# Patient Record
Sex: Female | Born: 2000 | Race: Black or African American | Hispanic: No | Marital: Single | Smoking: Never smoker
Health system: Southern US, Community
[De-identification: ages and names within clinical notes are randomized; demographics above are authoritative.]

## PROBLEM LIST (undated history)

## (undated) DIAGNOSIS — M2560 Stiffness of unspecified joint, not elsewhere classified: Secondary | ICD-10-CM

## (undated) DIAGNOSIS — J45909 Unspecified asthma, uncomplicated: Secondary | ICD-10-CM

## (undated) DIAGNOSIS — Z973 Presence of spectacles and contact lenses: Secondary | ICD-10-CM

## (undated) DIAGNOSIS — M25562 Pain in left knee: Secondary | ICD-10-CM

---
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
## 2018-11-06 ENCOUNTER — Other Ambulatory Visit: Payer: Self-pay

## 2018-11-06 ENCOUNTER — Encounter (HOSPITAL_COMMUNITY): Payer: Self-pay

## 2018-11-06 ENCOUNTER — Ambulatory Visit (HOSPITAL_COMMUNITY)
Admission: EM | Admit: 2018-11-06 | Discharge: 2018-11-06 | Disposition: A | Discharge status: Home / Self Care | Payer: Self-pay | Attending: Family Medicine | Admitting: Family Medicine

## 2018-11-06 DIAGNOSIS — R69 Illness, unspecified: Secondary | ICD-10-CM

## 2018-11-06 DIAGNOSIS — J111 Influenza due to unidentified influenza virus with other respiratory manifestations: Secondary | ICD-10-CM

## 2018-11-06 MED ORDER — OSELTAMIVIR PHOSPHATE 75 MG PO CAPS: 75.0000 mg | ORAL_CAPSULE | Freq: Two times a day (BID) | ORAL | 0 refills | Status: DC

## 2018-11-06 NOTE — ED Provider Notes (Signed)
[facility]    CSN: [id number] Arrival date & time: 11/06/18  1043     History   Chief Complaint Chief Complaint  Patient presents with  . Diarrhea  . Headache    HPI Joanne Nelson is a 18 y.o. female.   Pt is a 18 year old female that presents with flu like symptoms to include; fever, chills, myalgias, sore throat, diarrhea.  This has been present and worsened over the past 2 days. Taking OTC meds for fever and symptoms with some relief and decrease in the fever. Positive sick contact but tested positive for flu. No recent traveling. Decrease in appetite but drinking fluids. No nausea and vomiting.   ROS per HPI       Past Medical History:  Diagnosis Date  . Asthma     There are no active problems to display for this patient.   History reviewed. No pertinent surgical history.  OB History   No obstetric history on file.      Home Medications    Prior to Admission medications   Medication Sig Start Date End Date Taking? Authorizing Provider  naproxen (NAPROSYN) 500 MG tablet Take 1 tablet (500 mg total) by mouth 2 (two) times daily. 12/31/17   Wieters, Hallie C, PA-C  oseltamivir (TAMIFLU) 75 MG capsule Take 1 capsule (75 mg total) by mouth every 12 (twelve) hours. 11/06/18   Janace Aris, NP    Family History History reviewed. No pertinent family history.  Social History Social History   Tobacco Use  . Smoking status: Never Smoker  . Smokeless tobacco: Never Used  Substance Use Topics  . Alcohol use: Never    Frequency: Never  . Drug use: Never     Allergies   Patient has no known allergies.   Review of Systems Review of Systems   Physical Exam Triage Vital Signs ED Triage Vitals  Enc Vitals Group     BP 11/06/18 1157 117/78     Pulse Rate 11/06/18 1157 77     Resp 11/06/18 1157 16     Temp 11/06/18 1157 98.3 F (36.8 C)     Temp Source 11/06/18 1157 Oral     SpO2 11/06/18 1157 100 %     Weight 11/06/18 1159 143 lb (64.9  kg)     Height --      Head Circumference --      Peak Flow --      Pain Score 11/06/18 1159 7     Pain Loc --      Pain Edu? --      Excl. in GC? --    No data found.  Updated Vital Signs BP 117/78 (BP Location: Right Arm)   Pulse 77   Temp 98.3 F (36.8 C) (Oral)   Resp 16   Wt 143 lb (64.9 kg)   LMP 10/02/2018   SpO2 100%   Visual Acuity Right Eye Distance:   Left Eye Distance:   Bilateral Distance:    Right Eye Near:   Left Eye Near:    Bilateral Near:     Physical Exam Vitals signs and nursing note reviewed.  Constitutional:      General: She is not in acute distress.    Appearance: She is well-developed. She is ill-appearing. She is not toxic-appearing or diaphoretic.  HENT:     Head: Normocephalic and atraumatic.     Mouth/Throat:     Mouth: Mucous membranes are moist.     Pharynx:  Oropharynx is clear.  Neck:     Musculoskeletal: Normal range of motion.  Cardiovascular:     Rate and Rhythm: Normal rate and regular rhythm.  Pulmonary:     Effort: Pulmonary effort is normal.     Breath sounds: Normal breath sounds.  Abdominal:     General: Bowel sounds are normal.     Palpations: Abdomen is soft.     Tenderness: There is no abdominal tenderness.  Musculoskeletal: Normal range of motion.  Skin:    General: Skin is warm and dry.  Neurological:     Mental Status: She is alert.  Psychiatric:        Mood and Affect: Mood normal.      UC Treatments / Results  Labs (all labs ordered are listed, but only abnormal results are displayed) Labs Reviewed - No data to display  EKG None  Radiology No results found.  Procedures Procedures (including critical care time)  Medications Ordered in UC Medications - No data to display  Initial Impression / Assessment and Plan / UC Course  I have reviewed the triage vital signs and the nursing notes.  Pertinent labs & imaging results that were available during my care of the patient were reviewed by me  and considered in my medical decision making (see chart for details).     Symptoms consistent with flu like illness She did have exposure to flu Tamiflu twice a day for 5 days Tylenol/ibuprofen for the myalgias and fever.  Follow up as needed for continued or worsening symptoms  Final Clinical Impressions(s) / UC Diagnoses   Final diagnoses:  Influenza-like illness     Discharge Instructions     We will go ahead and treat you for flu based on symptoms and exposure Tamiflu twice a day for next 5 days Tylenol/naproxen for pain and fevers Follow up as needed for continued or worsening symptoms     ED Prescriptions    Medication Sig Dispense Auth. Provider   oseltamivir (TAMIFLU) 75 MG capsule Take 1 capsule (75 mg total) by mouth every 12 (twelve) hours. 10 capsule Dahlia Byes A, NP     Controlled Substance Prescriptions [place] Controlled Substance Registry consulted? Not Applicable   Janace Aris, NP 11/06/18 [phone]

## 2018-11-06 NOTE — ED Triage Notes (Signed)
Pt cc headaches and body aches x 2 days the diarrhea started today.

## 2018-11-06 NOTE — Discharge Instructions (Addendum)
We will go ahead and treat you for flu based on symptoms and exposure Tamiflu twice a day for next 5 days Tylenol/naproxen for pain and fevers Follow up as needed for continued or worsening symptoms

## 2019-11-27 ENCOUNTER — Ambulatory Visit: Payer: Self-pay | Attending: Internal Medicine

## 2019-11-27 DIAGNOSIS — Z23 Encounter for immunization: Secondary | ICD-10-CM

## 2019-11-27 NOTE — Progress Notes (Signed)
   Covid-19 Vaccination Clinic  Name:  Joanne Nelson    MRN: [id number] DOB: 11-24-2000  11/27/2019  Joanne Nelson was observed post Covid-19 immunization for 15 minutes without incident. She was provided with Vaccine Information Sheet and instruction to access the V-Safe system.   Joanne Nelson was instructed to call 911 with any severe reactions post vaccine: Marland Kitchen Difficulty breathing  . Swelling of face and throat  . A fast heartbeat  . A bad rash all over body  . Dizziness and weakness   Immunizations Administered    Name Date Dose VIS Date Route   Moderna COVID-19 Vaccine 11/27/2019  3:30 PM 0.5 mL 08/19/2019 Intramuscular   Manufacturer: Moderna   Lot: [id number]   NDC: 43606-770-34

## 2019-12-30 ENCOUNTER — Ambulatory Visit: Payer: Self-pay | Attending: Family

## 2019-12-30 DIAGNOSIS — Z23 Encounter for immunization: Secondary | ICD-10-CM

## 2019-12-30 NOTE — Progress Notes (Signed)
   Covid-19 Vaccination Clinic  Name:  Joanne Nelson    MRN: [id number] DOB: [birth year]  12/30/2019  Joanne Nelson was observed post Covid-19 immunization for 15 minutes without incident. She was provided with Vaccine Information Sheet and instruction to access the V-Safe system.   Joanne Nelson was instructed to call 911 with any severe reactions post vaccine: Marland Kitchen Difficulty breathing  . Swelling of face and throat  . A fast heartbeat  . A bad rash all over body  . Dizziness and weakness   Immunizations Administered    Name Date Dose VIS Date Route   Moderna COVID-19 Vaccine 12/30/2019  1:46 PM 0.5 mL 08/19/2019 Intramuscular   Manufacturer: Moderna   Lot: [id number]   NDC: 17793-903-00

## 2020-07-01 ENCOUNTER — Other Ambulatory Visit: Payer: Self-pay

## 2020-07-01 ENCOUNTER — Emergency Department (INDEPENDENT_AMBULATORY_CARE_PROVIDER_SITE_OTHER)
Admission: RE | Admit: 2020-07-01 | Discharge: 2020-07-01 | Disposition: A | Discharge status: Home / Self Care | Payer: BC Managed Care – PPO | Source: Ambulatory Visit

## 2020-07-01 VITALS — BP 114/78 | HR 96 | Temp 99.0°F | Resp 16

## 2020-07-01 DIAGNOSIS — R590 Localized enlarged lymph nodes: Secondary | ICD-10-CM | POA: Diagnosis not present

## 2020-07-01 LAB — POCT CBC W AUTO DIFF (K'VILLE URGENT CARE)

## 2020-07-01 NOTE — Discharge Instructions (Signed)
°  Your symptoms are likely due to an early viral illness.  You may take 500mg  acetaminophen every 4-6 hours or in combination with ibuprofen 400-600mg  every 6-8 hours as needed for pain and inflammation.  Be sure to well hydrated with clear liquids and get at least 8 hours of sleep at night, preferably more while sick.   Please follow up with family medicine next week for recheck of symptoms if not improving.

## 2020-07-01 NOTE — ED Triage Notes (Signed)
Patient presents to Urgent Care with complaints of left sided neck pain/tenderness since about a week ago. Patient reports she does not have difficulty swallowing, but she can feel a "lump" on the left side of her neck when she swallows or touches it. Is tender to the touch.

## 2020-07-01 NOTE — ED Provider Notes (Signed)
[facility]    CSN: [id number] Arrival date & time: 07/01/20  0856      History   Chief Complaint Chief Complaint  Patient presents with  . Appointment    0900  . Neck Pain    HPI Joanne Nelson is a 19 y.o. female.   HPI  Joanne Nelson is a 19 y.o. female presenting to UC with c/o left sided neck soreness under her jaw that started about 1 week ago. Pain is aching and sore, tender "lump" on left side.  Denies fever, chills, n/v/d. No difficulty breathing or swallowing.  Pain is 7/10. She has not tried anything at home for the pain.    Past Medical History:  Diagnosis Date  . Asthma     There are no problems to display for this patient.   History reviewed. No pertinent surgical history.  OB History   No obstetric history on file.      Home Medications    Prior to Admission medications   Medication Sig Start Date End Date Taking? Authorizing Provider  naproxen (NAPROSYN) 500 MG tablet Take 1 tablet (500 mg total) by mouth 2 (two) times daily. 12/31/17   Wieters, Hallie C, PA-C  oseltamivir (TAMIFLU) 75 MG capsule Take 1 capsule (75 mg total) by mouth every 12 (twelve) hours. 11/06/18   Janace Aris, NP    Family History Family History  Problem Relation Age of Onset  . Hypertension Father     Social History Social History   Tobacco Use  . Smoking status: Never Smoker  . Smokeless tobacco: Never Used  Substance Use Topics  . Alcohol use: Yes    Comment: socially  . Drug use: Never     Allergies   Patient has no known allergies.   Review of Systems Review of Systems  Constitutional: Negative for chills and fever.  HENT: Negative for congestion, dental problem, ear pain, sore throat, trouble swallowing and voice change.   Respiratory: Negative for cough and shortness of breath.   Cardiovascular: Negative for chest pain and palpitations.  Gastrointestinal: Negative for abdominal pain, diarrhea, nausea and vomiting.  Musculoskeletal:  Positive for neck pain. Negative for arthralgias, back pain, myalgias and neck stiffness.  Skin: Negative for rash.  Neurological: Negative for dizziness, light-headedness and headaches.  All other systems reviewed and are negative.    Physical Exam Triage Vital Signs ED Triage Vitals  Enc Vitals Group     BP 07/01/20 0910 114/78     Pulse Rate 07/01/20 0910 96     Resp 07/01/20 0910 16     Temp 07/01/20 0910 99 F (37.2 C)     Temp Source 07/01/20 0910 Oral     SpO2 07/01/20 0910 97 %     Weight --      Height --      Head Circumference --      Peak Flow --      Pain Score 07/01/20 0908 7     Pain Loc --      Pain Edu? --      Excl. in GC? --    No data found.  Updated Vital Signs BP 114/78 (BP Location: Left Arm)   Pulse 96   Temp 99 F (37.2 C) (Oral)   Resp 16   SpO2 97%   Visual Acuity Right Eye Distance:   Left Eye Distance:   Bilateral Distance:    Right Eye Near:   Left Eye Near:  Bilateral Near:     Physical Exam Vitals and nursing note reviewed.  Constitutional:      Appearance: Normal appearance. She is well-developed.  HENT:     Head: Normocephalic and atraumatic.     Right Ear: Tympanic membrane and ear canal normal.     Left Ear: Tympanic membrane and ear canal normal.     Nose: Nose normal.     Right Sinus: No maxillary sinus tenderness or frontal sinus tenderness.     Left Sinus: No maxillary sinus tenderness or frontal sinus tenderness.     Mouth/Throat:     Lips: Pink.     Mouth: Mucous membranes are moist.     Dentition: Normal dentition. No dental tenderness, gingival swelling, dental caries or dental abscesses.     Pharynx: Oropharynx is clear. Uvula midline. No pharyngeal swelling, oropharyngeal exudate, posterior oropharyngeal erythema or uvula swelling.  Neck:     Vascular: No carotid bruit.   Cardiovascular:     Rate and Rhythm: Normal rate and regular rhythm.  Pulmonary:     Effort: Pulmonary effort is normal. No  respiratory distress.     Breath sounds: Normal breath sounds. No stridor. No wheezing, rhonchi or rales.  Musculoskeletal:        General: Normal range of motion.     Cervical back: Normal range of motion and neck supple. No rigidity or tenderness.  Lymphadenopathy:     Cervical: Cervical adenopathy present.  Skin:    General: Skin is warm and dry.  Neurological:     Mental Status: She is alert and oriented to person, place, and time.  Psychiatric:        Behavior: Behavior normal.      UC Treatments / Results  Labs (all labs ordered are listed, but only abnormal results are displayed) Labs Reviewed  POCT CBC W AUTO DIFF ([facility])    EKG   Radiology No results found.  Procedures Procedures (including critical care time)  Medications Ordered in UC Medications - No data to display  Initial Impression / Assessment and Plan / UC Course  I have reviewed the triage vital signs and the nursing notes.  Pertinent labs & imaging results that were available during my care of the patient were reviewed by me and considered in my medical decision making (see chart for details).    CBC: unremarkable Hx and exam c/w cervical lymphadenopathy without evidence of bacterial infection at this time Encouraged symptomatic tx and monitoring, f/u with PCP next week if not improving, especially if new symptoms develop AVS given  Final Clinical Impressions(s) / UC Diagnoses   Final diagnoses:  Anterior cervical lymphadenopathy     Discharge Instructions      Your symptoms are likely due to an early viral illness.  You may take 500mg  acetaminophen every 4-6 hours or in combination with ibuprofen 400-600mg  every 6-8 hours as needed for pain and inflammation.  Be sure to well hydrated with clear liquids and get at least 8 hours of sleep at night, preferably more while sick.   Please follow up with family medicine next week for recheck of symptoms if not improving.       ED Prescriptions    None     PDMP not reviewed this encounter.   , PA-C 07/01/20 1059

## 2020-09-12 ENCOUNTER — Emergency Department (HOSPITAL_COMMUNITY): Payer: BC Managed Care – PPO

## 2020-09-12 ENCOUNTER — Inpatient Hospital Stay (HOSPITAL_COMMUNITY)
Admission: EM | Admit: 2020-09-12 | Discharge: 2020-09-16 | DRG: 481 | Disposition: A | Discharge status: Home / Self Care | Payer: BC Managed Care – PPO | Attending: Surgery | Admitting: Surgery

## 2020-09-12 DIAGNOSIS — S2249XA Multiple fractures of ribs, unspecified side, initial encounter for closed fracture: Secondary | ICD-10-CM

## 2020-09-12 DIAGNOSIS — S2241XA Multiple fractures of ribs, right side, initial encounter for closed fracture: Secondary | ICD-10-CM | POA: Diagnosis present

## 2020-09-12 DIAGNOSIS — E876 Hypokalemia: Secondary | ICD-10-CM | POA: Diagnosis present

## 2020-09-12 DIAGNOSIS — D62 Acute posthemorrhagic anemia: Secondary | ICD-10-CM | POA: Diagnosis not present

## 2020-09-12 DIAGNOSIS — Y92413 State road as the place of occurrence of the external cause: Secondary | ICD-10-CM | POA: Diagnosis not present

## 2020-09-12 DIAGNOSIS — S72392A Other fracture of shaft of left femur, initial encounter for closed fracture: Secondary | ICD-10-CM | POA: Diagnosis present

## 2020-09-12 DIAGNOSIS — S61217A Laceration without foreign body of left little finger without damage to nail, initial encounter: Secondary | ICD-10-CM | POA: Diagnosis present

## 2020-09-12 DIAGNOSIS — S0081XA Abrasion of other part of head, initial encounter: Secondary | ICD-10-CM | POA: Diagnosis present

## 2020-09-12 DIAGNOSIS — R52 Pain, unspecified: Secondary | ICD-10-CM

## 2020-09-12 DIAGNOSIS — S7290XA Unspecified fracture of unspecified femur, initial encounter for closed fracture: Secondary | ICD-10-CM | POA: Diagnosis present

## 2020-09-12 DIAGNOSIS — S61412A Laceration without foreign body of left hand, initial encounter: Secondary | ICD-10-CM | POA: Diagnosis present

## 2020-09-12 DIAGNOSIS — T148XXA Other injury of unspecified body region, initial encounter: Secondary | ICD-10-CM

## 2020-09-12 DIAGNOSIS — S81012A Laceration without foreign body, left knee, initial encounter: Secondary | ICD-10-CM | POA: Diagnosis present

## 2020-09-12 DIAGNOSIS — T1490XA Injury, unspecified, initial encounter: Secondary | ICD-10-CM

## 2020-09-12 DIAGNOSIS — M795 Residual foreign body in soft tissue: Secondary | ICD-10-CM

## 2020-09-12 DIAGNOSIS — M25552 Pain in left hip: Secondary | ICD-10-CM | POA: Diagnosis present

## 2020-09-12 DIAGNOSIS — Z20822 Contact with and (suspected) exposure to covid-19: Secondary | ICD-10-CM | POA: Diagnosis present

## 2020-09-12 DIAGNOSIS — S7292XA Unspecified fracture of left femur, initial encounter for closed fracture: Secondary | ICD-10-CM

## 2020-09-12 LAB — I-STAT BETA HCG BLOOD, ED (MC, WL, AP ONLY): I-stat hCG, quantitative: <5 m[IU]/mL (ref <5)

## 2020-09-12 LAB — CBC
HCT: 37.2 % (ref 36.0–46.0)
Hemoglobin: 11.9 g/dL — ABNORMAL LOW (ref 12.0–15.0)
MCH: 29.5 pg (ref 26.0–34.0)
MCHC: 32 g/dL (ref 30.0–36.0)
MCV: 92.1 fL (ref 80.0–100.0)
Platelets: 409 10*3/uL — ABNORMAL HIGH (ref 150–400)
RBC: 4.04 MIL/uL (ref 3.87–5.11)
RDW: 12.9 % (ref 11.5–15.5)
WBC: 14.9 10*3/uL — ABNORMAL HIGH (ref 4.0–10.5)
nRBC: 0 % (ref 0.0–0.2)

## 2020-09-12 LAB — I-STAT CHEM 8, ED
BUN: 13 mg/dL (ref 6–20)
Calcium, Ion: 1.12 mmol/L — ABNORMAL LOW (ref 1.15–1.40)
Chloride: 104 mmol/L (ref 98–111)
Creatinine, Ser: 1.2 mg/dL — ABNORMAL HIGH (ref 0.44–1.00)
Glucose, Bld: 166 mg/dL — ABNORMAL HIGH (ref 70–99)
HCT: 40 % (ref 36.0–46.0)
Hemoglobin: 13.6 g/dL (ref 12.0–15.0)
Potassium: 2.7 mmol/L — CL (ref 3.5–5.1)
Sodium: 141 mmol/L (ref 135–145)
TCO2: 20 mmol/L — ABNORMAL LOW (ref 22–32)

## 2020-09-12 LAB — SAMPLE TO BLOOD BANK

## 2020-09-12 LAB — PROTIME-INR
INR: 1.2 (ref 0.8–1.2)
Prothrombin Time: 14.6 seconds (ref 11.4–15.2)

## 2020-09-12 LAB — LACTIC ACID, PLASMA: Lactic Acid, Venous: 3.6 mmol/L (ref 0.5–1.9)

## 2020-09-12 LAB — ETHANOL: Alcohol, Ethyl (B): <10 mg/dL (ref <10)

## 2020-09-12 MED ORDER — DOCUSATE SODIUM 100 MG PO CAPS: 100.0000 mg | ORAL_CAPSULE | Freq: Two times a day (BID) | ORAL | Status: DC

## 2020-09-12 MED ORDER — ONDANSETRON HCL 4 MG/2ML IJ SOLN
4.0000 mg | Freq: Four times a day (QID) | INTRAMUSCULAR | Status: DC
  Administered 2020-09-13 – 2020-09-15 (×10): 4 mg via INTRAVENOUS
  Filled 2020-09-12 (×12): qty 2

## 2020-09-12 MED ORDER — SODIUM CHLORIDE 0.9 % IV BOLUS
125.0000 mL | Freq: Once | INTRAVENOUS | Status: AC
  Administered 2020-09-12: 125 mL via INTRAVENOUS

## 2020-09-12 MED ORDER — ONDANSETRON HCL 4 MG/2ML IJ SOLN
4.0000 mg | Freq: Four times a day (QID) | INTRAMUSCULAR | Status: DC | PRN
  Administered 2020-09-13: 4 mg via INTRAVENOUS

## 2020-09-12 MED ORDER — MORPHINE SULFATE (PF) 2 MG/ML IV SOLN
2.0000 mg | INTRAVENOUS | Status: DC | PRN
  Administered 2020-09-13 (×2): 4 mg via INTRAVENOUS
  Filled 2020-09-12 (×2): qty 2

## 2020-09-12 MED ORDER — HYDROMORPHONE HCL 1 MG/ML IJ SOLN
1.0000 mg | INTRAMUSCULAR | Status: DC | PRN
  Administered 2020-09-13 (×2): 1 mg via INTRAVENOUS
  Filled 2020-09-12 (×2): qty 1

## 2020-09-12 MED ORDER — OXYCODONE HCL 5 MG PO TABS
5.0000 mg | ORAL_TABLET | ORAL | Status: DC | PRN
  Administered 2020-09-13: 10 mg via ORAL
  Filled 2020-09-12: qty 2

## 2020-09-12 MED ORDER — LACTATED RINGERS IV SOLN: INTRAVENOUS | Status: DC

## 2020-09-12 MED ORDER — CEFAZOLIN SODIUM-DEXTROSE 2-4 GM/100ML-% IV SOLN
2.0000 g | Freq: Once | INTRAVENOUS | Status: AC
  Administered 2020-09-12: 2 g via INTRAVENOUS
  Filled 2020-09-12: qty 100

## 2020-09-12 MED ORDER — ONDANSETRON 4 MG PO TBDP: 4.0000 mg | ORAL_TABLET | Freq: Four times a day (QID) | ORAL | Status: DC | PRN

## 2020-09-12 MED ORDER — ENOXAPARIN SODIUM 30 MG/0.3ML ~~LOC~~ SOLN
30.0000 mg | Freq: Two times a day (BID) | SUBCUTANEOUS | Status: DC
  Administered 2020-09-13 – 2020-09-16 (×6): 30 mg via SUBCUTANEOUS
  Filled 2020-09-12 (×6): qty 0.3

## 2020-09-12 MED ORDER — HYDROMORPHONE HCL 1 MG/ML IJ SOLN
INTRAMUSCULAR | Status: AC
  Administered 2020-09-12: 1 mg via INTRAVENOUS
  Filled 2020-09-12: qty 1

## 2020-09-12 MED ORDER — IOHEXOL 300 MG/ML  SOLN
100.0000 mL | Freq: Once | INTRAMUSCULAR | Status: AC | PRN
  Administered 2020-09-12: 100 mL via INTRAVENOUS

## 2020-09-12 MED ORDER — ACETAMINOPHEN 500 MG PO TABS
1000.0000 mg | ORAL_TABLET | Freq: Four times a day (QID) | ORAL | Status: DC
  Administered 2020-09-13 – 2020-09-16 (×13): 1000 mg via ORAL
  Filled 2020-09-12 (×14): qty 2
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
## 2020-09-12 NOTE — ED Notes (Signed)
pts family at bedside
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
## 2020-09-13 ENCOUNTER — Inpatient Hospital Stay (HOSPITAL_COMMUNITY): Payer: BC Managed Care – PPO

## 2020-09-13 ENCOUNTER — Inpatient Hospital Stay (HOSPITAL_COMMUNITY): Payer: BC Managed Care – PPO | Admitting: Anesthesiology

## 2020-09-13 ENCOUNTER — Encounter (HOSPITAL_COMMUNITY): Admission: EM | Disposition: A | Discharge status: Home / Self Care | Payer: Self-pay | Source: Home / Self Care

## 2020-09-13 HISTORY — PX: OTHER SURGICAL HISTORY: SHX169

## 2020-09-13 HISTORY — PX: FEMUR IM NAIL: SHX1597

## 2020-09-13 LAB — COMPREHENSIVE METABOLIC PANEL
ALT: 154 U/L — ABNORMAL HIGH (ref 0–44)
AST: 304 U/L — ABNORMAL HIGH (ref 15–41)
Albumin: 3.6 g/dL (ref 3.5–5.0)
Alkaline Phosphatase: 42 U/L (ref 38–126)
Anion gap: 15 (ref 5–15)
BUN: 13 mg/dL (ref 6–20)
CO2: 20 mmol/L — ABNORMAL LOW (ref 22–32)
Calcium: 9 mg/dL (ref 8.9–10.3)
Chloride: 104 mmol/L (ref 98–111)
Creatinine, Ser: 1.29 mg/dL — ABNORMAL HIGH (ref 0.44–1.00)
GFR, Estimated: >60 mL/min (ref >60)
Glucose, Bld: 173 mg/dL — ABNORMAL HIGH (ref 70–99)
Potassium: 2.7 mmol/L — CL (ref 3.5–5.1)
Sodium: 139 mmol/L (ref 135–145)
Total Bilirubin: 0.4 mg/dL (ref 0.3–1.2)
Total Protein: 6.9 g/dL (ref 6.5–8.1)

## 2020-09-13 LAB — BASIC METABOLIC PANEL
Anion gap: 9 (ref 5–15)
BUN: 11 mg/dL (ref 6–20)
CO2: 24 mmol/L (ref 22–32)
Calcium: 8.2 mg/dL — ABNORMAL LOW (ref 8.9–10.3)
Chloride: 103 mmol/L (ref 98–111)
Creatinine, Ser: 0.92 mg/dL (ref 0.44–1.00)
GFR, Estimated: >60 mL/min (ref >60)
Glucose, Bld: 121 mg/dL — ABNORMAL HIGH (ref 70–99)
Potassium: 3.9 mmol/L (ref 3.5–5.1)
Sodium: 136 mmol/L (ref 135–145)

## 2020-09-13 LAB — CBC
HCT: 26.5 % — ABNORMAL LOW (ref 36.0–46.0)
HCT: 22.3 % — ABNORMAL LOW (ref 36.0–46.0)
Hemoglobin: 8.6 g/dL — ABNORMAL LOW (ref 12.0–15.0)
Hemoglobin: 7.2 g/dL — ABNORMAL LOW (ref 12.0–15.0)
MCH: 29.4 pg (ref 26.0–34.0)
MCH: 29.5 pg (ref 26.0–34.0)
MCHC: 32.5 g/dL (ref 30.0–36.0)
MCHC: 32.3 g/dL (ref 30.0–36.0)
MCV: 90.4 fL (ref 80.0–100.0)
MCV: 91.4 fL (ref 80.0–100.0)
Platelets: 297 10*3/uL (ref 150–400)
Platelets: 254 10*3/uL (ref 150–400)
RBC: 2.93 MIL/uL — ABNORMAL LOW (ref 3.87–5.11)
RBC: 2.44 MIL/uL — ABNORMAL LOW (ref 3.87–5.11)
RDW: 12.9 % (ref 11.5–15.5)
RDW: 12.9 % (ref 11.5–15.5)
WBC: 8 10*3/uL (ref 4.0–10.5)
WBC: 8 10*3/uL (ref 4.0–10.5)
nRBC: 0 % (ref 0.0–0.2)
nRBC: 0 % (ref 0.0–0.2)

## 2020-09-13 LAB — PREPARE RBC (CROSSMATCH)

## 2020-09-13 LAB — RESP PANEL BY RT-PCR (FLU A&B, COVID) ARPGX2
Influenza A by PCR: NEGATIVE
Influenza B by PCR: NEGATIVE
SARS Coronavirus 2 by RT PCR: NEGATIVE

## 2020-09-13 LAB — HIV ANTIBODY (ROUTINE TESTING W REFLEX): HIV Screen 4th Generation wRfx: NONREACTIVE

## 2020-09-13 LAB — ABO/RH: ABO/RH(D): A POS

## 2020-09-13 LAB — MAGNESIUM: Magnesium: 1.8 mg/dL (ref 1.7–2.4)

## 2020-09-13 SURGERY — INSERTION, INTRAMEDULLARY ROD, FEMUR
Anesthesia: General | Site: Leg Upper | Laterality: Left

## 2020-09-13 MED ORDER — POTASSIUM CHLORIDE 10 MEQ/100ML IV SOLN
10.0000 meq | INTRAVENOUS | Status: DC
  Administered 2020-09-13: 10 meq via INTRAVENOUS
  Filled 2020-09-13: qty 100

## 2020-09-13 MED ORDER — CEFAZOLIN SODIUM-DEXTROSE 2-4 GM/100ML-% IV SOLN
2.0000 g | INTRAVENOUS | Status: AC
  Administered 2020-09-13: 2 g via INTRAVENOUS

## 2020-09-13 MED ORDER — ONDANSETRON HCL 4 MG/2ML IJ SOLN
INTRAMUSCULAR | Status: DC | PRN
  Administered 2020-09-13: 4 mg via INTRAVENOUS

## 2020-09-13 MED ORDER — SUGAMMADEX SODIUM 200 MG/2ML IV SOLN
INTRAVENOUS | Status: DC | PRN
  Administered 2020-09-13: 200 mg via INTRAVENOUS

## 2020-09-13 MED ORDER — SCOPOLAMINE 1 MG/3DAYS TD PT72: 1.0000 | MEDICATED_PATCH | TRANSDERMAL | Status: DC

## 2020-09-13 MED ORDER — TRANEXAMIC ACID-NACL 1000-0.7 MG/100ML-% IV SOLN
INTRAVENOUS | Status: AC
  Filled 2020-09-13: qty 100

## 2020-09-13 MED ORDER — PROPOFOL 10 MG/ML IV BOLUS
INTRAVENOUS | Status: AC
  Filled 2020-09-13: qty 20

## 2020-09-13 MED ORDER — SCOPOLAMINE 1 MG/3DAYS TD PT72
MEDICATED_PATCH | TRANSDERMAL | Status: AC
  Administered 2020-09-13: 1.5 mg via TRANSDERMAL
  Filled 2020-09-13: qty 1

## 2020-09-13 MED ORDER — SODIUM CHLORIDE 0.9 % IR SOLN
Status: DC | PRN
  Administered 2020-09-13: 1000 mL

## 2020-09-13 MED ORDER — CHLORHEXIDINE GLUCONATE 4 % EX LIQD: 60.0000 mL | Freq: Once | CUTANEOUS | Status: DC

## 2020-09-13 MED ORDER — TRANEXAMIC ACID-NACL 1000-0.7 MG/100ML-% IV SOLN
1000.0000 mg | INTRAVENOUS | Status: AC
  Administered 2020-09-13: 1000 mg via INTRAVENOUS

## 2020-09-13 MED ORDER — CHLORHEXIDINE GLUCONATE 0.12 % MT SOLN: 15.0000 mL | Freq: Once | OROMUCOSAL | Status: AC

## 2020-09-13 MED ORDER — OXYCODONE HCL 5 MG PO TABS
5.0000 mg | ORAL_TABLET | ORAL | Status: DC | PRN
  Administered 2020-09-14 – 2020-09-15 (×3): 10 mg via ORAL
  Filled 2020-09-13 (×4): qty 2

## 2020-09-13 MED ORDER — DEXMEDETOMIDINE (PRECEDEX) IN NS 20 MCG/5ML (4 MCG/ML) IV SYRINGE
PREFILLED_SYRINGE | INTRAVENOUS | Status: DC | PRN
  Administered 2020-09-13: 4 ug via INTRAVENOUS
  Administered 2020-09-13 (×2): 8 ug via INTRAVENOUS

## 2020-09-13 MED ORDER — CELECOXIB 200 MG PO CAPS
ORAL_CAPSULE | ORAL | Status: AC
  Administered 2020-09-13: 200 mg via ORAL
  Filled 2020-09-13: qty 1

## 2020-09-13 MED ORDER — ACETAMINOPHEN 325 MG PO TABS: 325.0000 mg | ORAL_TABLET | Freq: Four times a day (QID) | ORAL | Status: DC | PRN

## 2020-09-13 MED ORDER — ONDANSETRON HCL 4 MG PO TABS: 4.0000 mg | ORAL_TABLET | Freq: Four times a day (QID) | ORAL | Status: DC | PRN

## 2020-09-13 MED ORDER — ALBUMIN HUMAN 5 % IV SOLN: INTRAVENOUS | Status: DC | PRN

## 2020-09-13 MED ORDER — CELECOXIB 200 MG PO CAPS: 200.0000 mg | ORAL_CAPSULE | Freq: Once | ORAL | Status: AC

## 2020-09-13 MED ORDER — PROPOFOL 10 MG/ML IV BOLUS
INTRAVENOUS | Status: DC | PRN
  Administered 2020-09-13: 150 mg via INTRAVENOUS

## 2020-09-13 MED ORDER — OXYCODONE HCL 5 MG PO TABS
10.0000 mg | ORAL_TABLET | ORAL | Status: DC | PRN
  Administered 2020-09-13 – 2020-09-16 (×2): 10 mg via ORAL
  Filled 2020-09-13: qty 2

## 2020-09-13 MED ORDER — MIDAZOLAM HCL 2 MG/2ML IJ SOLN
INTRAMUSCULAR | Status: AC
  Filled 2020-09-13: qty 2

## 2020-09-13 MED ORDER — PHENYLEPHRINE 40 MCG/ML (10ML) SYRINGE FOR IV PUSH (FOR BLOOD PRESSURE SUPPORT)
PREFILLED_SYRINGE | INTRAVENOUS | Status: DC | PRN
  Administered 2020-09-13: 120 ug via INTRAVENOUS

## 2020-09-13 MED ORDER — CEFAZOLIN SODIUM-DEXTROSE 1-4 GM/50ML-% IV SOLN
1.0000 g | Freq: Four times a day (QID) | INTRAVENOUS | Status: AC
  Administered 2020-09-13 – 2020-09-14 (×3): 1 g via INTRAVENOUS
  Filled 2020-09-13 (×3): qty 50

## 2020-09-13 MED ORDER — LACTATED RINGERS IV BOLUS
1000.0000 mL | Freq: Once | INTRAVENOUS | Status: AC
  Administered 2020-09-13: 1000 mL via INTRAVENOUS

## 2020-09-13 MED ORDER — POVIDONE-IODINE 10 % EX SWAB: 2.0000 "application " | Freq: Once | CUTANEOUS | Status: DC

## 2020-09-13 MED ORDER — PROMETHAZINE HCL 25 MG/ML IJ SOLN: 6.2500 mg | INTRAMUSCULAR | Status: DC | PRN

## 2020-09-13 MED ORDER — ROCURONIUM BROMIDE 10 MG/ML (PF) SYRINGE
PREFILLED_SYRINGE | INTRAVENOUS | Status: DC | PRN
  Administered 2020-09-13: 80 mg via INTRAVENOUS
  Administered 2020-09-13: 50 mg via INTRAVENOUS

## 2020-09-13 MED ORDER — PHENYLEPHRINE HCL-NACL 10-0.9 MG/250ML-% IV SOLN
INTRAVENOUS | Status: DC | PRN
  Administered 2020-09-13: 30 ug/min via INTRAVENOUS

## 2020-09-13 MED ORDER — DEXAMETHASONE SODIUM PHOSPHATE 10 MG/ML IJ SOLN
INTRAMUSCULAR | Status: DC | PRN
  Administered 2020-09-13: 10 mg via INTRAVENOUS

## 2020-09-13 MED ORDER — HYDROMORPHONE HCL 1 MG/ML IJ SOLN
INTRAMUSCULAR | Status: AC
  Administered 2020-09-13: 0.25 mg via INTRAVENOUS
  Filled 2020-09-13: qty 1

## 2020-09-13 MED ORDER — CHLORHEXIDINE GLUCONATE 0.12 % MT SOLN
OROMUCOSAL | Status: AC
  Administered 2020-09-13: 15 mL via OROMUCOSAL
  Filled 2020-09-13: qty 15

## 2020-09-13 MED ORDER — ONDANSETRON HCL 4 MG/2ML IJ SOLN
4.0000 mg | Freq: Four times a day (QID) | INTRAMUSCULAR | Status: DC | PRN
  Administered 2020-09-15: 4 mg via INTRAVENOUS

## 2020-09-13 MED ORDER — METOCLOPRAMIDE HCL 5 MG/ML IJ SOLN: 5.0000 mg | Freq: Three times a day (TID) | INTRAMUSCULAR | Status: DC | PRN

## 2020-09-13 MED ORDER — LIDOCAINE 2% (20 MG/ML) 5 ML SYRINGE
INTRAMUSCULAR | Status: DC | PRN
  Administered 2020-09-13: 100 mg via INTRAVENOUS

## 2020-09-13 MED ORDER — FENTANYL CITRATE (PF) 250 MCG/5ML IJ SOLN
INTRAMUSCULAR | Status: DC | PRN
  Administered 2020-09-13 (×5): 50 ug via INTRAVENOUS

## 2020-09-13 MED ORDER — DOCUSATE SODIUM 100 MG PO CAPS
100.0000 mg | ORAL_CAPSULE | Freq: Two times a day (BID) | ORAL | Status: DC
  Administered 2020-09-13 – 2020-09-16 (×5): 100 mg via ORAL
  Filled 2020-09-13 (×6): qty 1

## 2020-09-13 MED ORDER — MIDAZOLAM HCL 5 MG/5ML IJ SOLN
INTRAMUSCULAR | Status: DC | PRN
  Administered 2020-09-13: 2 mg via INTRAVENOUS

## 2020-09-13 MED ORDER — SODIUM CHLORIDE 0.9% IV SOLUTION: Freq: Once | INTRAVENOUS | Status: DC

## 2020-09-13 MED ORDER — HYDROMORPHONE HCL 1 MG/ML IJ SOLN
0.2500 mg | INTRAMUSCULAR | Status: DC | PRN
  Administered 2020-09-13: 0.25 mg via INTRAVENOUS

## 2020-09-13 MED ORDER — METOCLOPRAMIDE HCL 5 MG PO TABS: 5.0000 mg | ORAL_TABLET | Freq: Three times a day (TID) | ORAL | Status: DC | PRN

## 2020-09-13 MED ORDER — CEFAZOLIN SODIUM-DEXTROSE 2-4 GM/100ML-% IV SOLN
INTRAVENOUS | Status: AC
  Filled 2020-09-13: qty 100

## 2020-09-13 MED ORDER — FENTANYL CITRATE (PF) 250 MCG/5ML IJ SOLN
INTRAMUSCULAR | Status: AC
  Filled 2020-09-13: qty 5

## 2020-09-13 SURGICAL SUPPLY — 61 items
BIT DRILL AO GAMMA 4.2X180 (BIT) ×2 IMPLANT
BIT DRILL AO GAMMA 4.2X340 (BIT) ×2 IMPLANT
BIT DRILL CANN 2.7 (BIT) ×1
BIT DRILL CANN 3.7 (Screw) ×2 IMPLANT
BIT DRILL SRG 2.7XCANN AO CPLG (BIT) ×1 IMPLANT
BIT DRL SRG 2.7XCANN AO CPLNG (BIT) ×1
CLSR STERI-STRIP ANTIMIC 1/2X4 (GAUZE/BANDAGES/DRESSINGS) ×2 IMPLANT
COVER MAYO STAND STRL (DRAPES) ×2 IMPLANT
COVER PERINEAL POST (MISCELLANEOUS) ×2 IMPLANT
COVER SURGICAL LIGHT HANDLE (MISCELLANEOUS) ×2 IMPLANT
COVER WAND RF STERILE (DRAPES) ×2 IMPLANT
DRAPE STERI IOBAN 125X83 (DRAPES) ×2 IMPLANT
DRSG MEPILEX BORDER 4X4 (GAUZE/BANDAGES/DRESSINGS) ×6 IMPLANT
DURAPREP 26ML APPLICATOR (WOUND CARE) ×2 IMPLANT
ELECT REM PT RETURN 9FT ADLT (ELECTROSURGICAL) ×2
ELECTRODE REM PT RTRN 9FT ADLT (ELECTROSURGICAL) ×1 IMPLANT
GLOVE BIO SURGEON STRL SZ7.5 (GLOVE) ×2 IMPLANT
GLOVE BIOGEL PI IND STRL 7.5 (GLOVE) ×1 IMPLANT
GLOVE BIOGEL PI IND STRL 8 (GLOVE) ×1 IMPLANT
GLOVE BIOGEL PI INDICATOR 7.5 (GLOVE) ×1
GLOVE BIOGEL PI INDICATOR 8 (GLOVE) ×1
GLOVE SURG SYN 7.5  E (GLOVE) ×1
GLOVE SURG SYN 7.5 E (GLOVE) ×1 IMPLANT
GOWN STRL REUS W/ TWL LRG LVL3 (GOWN DISPOSABLE) ×2 IMPLANT
GOWN STRL REUS W/ TWL XL LVL3 (GOWN DISPOSABLE) ×2 IMPLANT
GOWN STRL REUS W/TWL LRG LVL3 (GOWN DISPOSABLE) ×2
GOWN STRL REUS W/TWL XL LVL3 (GOWN DISPOSABLE) ×2
GUIDEPIN SMOOTH TT 2.4X9 (Screw) ×2 IMPLANT
GUIDEROD T2 3X1000 (ROD) ×2 IMPLANT
IMMOBILIZER KNEE 20 (SOFTGOODS) ×2
IMMOBILIZER KNEE 20 THIGH 36 (SOFTGOODS) ×1 IMPLANT
K-WIRE ORTHOPEDIC 1.4X150L (WIRE) ×4
K-WIRE RECON 3.2X400 (WIRE) ×4
KIT BASIN OR (CUSTOM PROCEDURE TRAY) ×2 IMPLANT
KIT TURNOVER KIT B (KITS) ×2 IMPLANT
KWIRE ORTHOPEDIC 1.4X150L (WIRE) ×2 IMPLANT
KWIRE RECON 3.2X400 (WIRE) ×2 IMPLANT
MANIFOLD NEPTUNE II (INSTRUMENTS) ×2 IMPLANT
NAIL RECONSTRUCTION 9X340X125 (Nail) ×2 IMPLANT
NS IRRIG 1000ML POUR BTL (IV SOLUTION) ×2 IMPLANT
PACK GENERAL/GYN (CUSTOM PROCEDURE TRAY) ×2 IMPLANT
PAD ARMBOARD 7.5X6 YLW CONV (MISCELLANEOUS) ×4 IMPLANT
REAMER SHAFT BIXCUT (INSTRUMENTS) ×2 IMPLANT
SCREW CANN LAG RECON 6.5X75 (Screw) ×2 IMPLANT
SCREW CANN SD 5.5X70 (Screw) ×2 IMPLANT
SCREW CANNULATED 4.0X70MM (Screw) ×2 IMPLANT
SCREW LAG RECON 6.5X80MM (Screw) ×2 IMPLANT
SCREW LOCKING T2 F/T  5X42.5MM (Screw) ×1 IMPLANT
SCREW LOCKING T2 F/T 5X42.5MM (Screw) ×1 IMPLANT
SCREW LOCKING THREADED 5X47.5 (Screw) ×2 IMPLANT
SCREW SET RECON (Screw) ×2 IMPLANT
STEPDRILL FOR LAG SCREW RECON (DRILL) ×2 IMPLANT
STRIP CLOSURE SKIN 1/2X4 (GAUZE/BANDAGES/DRESSINGS) ×2 IMPLANT
SUT MNCRL AB 4-0 PS2 18 (SUTURE) ×2 IMPLANT
SUT VIC AB 0 CT1 27 (SUTURE) ×1
SUT VIC AB 0 CT1 27XBRD ANBCTR (SUTURE) ×1 IMPLANT
SUT VIC AB 2-0 CT1 27 (SUTURE) ×1
SUT VIC AB 2-0 CT1 TAPERPNT 27 (SUTURE) ×1 IMPLANT
TOWEL GREEN STERILE (TOWEL DISPOSABLE) ×2 IMPLANT
TOWEL OR NON WOVEN STRL DISP B (DISPOSABLE) ×2 IMPLANT
WATER STERILE IRR 1000ML POUR (IV SOLUTION) ×2 IMPLANT

## 2020-09-13 NOTE — ED Notes (Signed)
Ortho tech contacted for [employer] placement

## 2020-09-13 NOTE — ED Notes (Signed)
Mother at bedside with patient and was updated on plan of care, verbalized understanding and denies further questions. Patient provided with additional warm blanket per her request. They deny further needs. Traction remains in place to LLE with good cap refill to distal extremity.
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
## 2020-09-13 NOTE — ED Notes (Signed)
Attempted report 
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
## 2020-09-13 NOTE — Anesthesia Preprocedure Evaluation (Addendum)
Anesthesia Evaluation  Patient identified by MRN, date of birth, ID band Patient awake    Reviewed: Allergy & Precautions, NPO status , Patient's Chart, lab work & pertinent test results  History of Anesthesia Complications Negative for: history of anesthetic complications  Airway Mallampati: II  TM Distance: >3 FB Neck ROM: Full    Dental no notable dental hx. (+) Dental Advisory Given   Pulmonary  Multiple FX ribs (R3-6)   Pulmonary exam normal        Cardiovascular negative cardio ROS   Rhythm:Regular Rate:Normal     Neuro/Psych negative neurological ROS     GI/Hepatic negative GI ROS, Neg liver ROS,   Endo/Other  negative endocrine ROS  Renal/GU negative Renal ROS     Musculoskeletal   Abdominal   Peds  Hematology negative hematology ROS (+)   Anesthesia Other Findings   Reproductive/Obstetrics                            Anesthesia Physical Anesthesia Plan  ASA: III  Anesthesia Plan: General   Post-op Pain Management:    Induction: Intravenous  PONV Risk Score and Plan: 4 or greater and Ondansetron, Dexamethasone, Midazolam and Scopolamine patch - Pre-op  Airway Management Planned: Oral ETT  Additional Equipment:   Intra-op Plan:   Post-operative Plan: Extubation in OR  Informed Consent: I have reviewed the patients History and Physical, chart, labs and discussed the procedure including the risks, benefits and alternatives for the proposed anesthesia with the patient or authorized representative who has indicated his/her understanding and acceptance.     Dental advisory given  Plan Discussed with: Anesthesiologist and CRNA  Anesthesia Plan Comments:        Anesthesia Quick Evaluation

## 2020-09-13 NOTE — OR Nursing (Signed)
Pt is awake,alert and oriented.Pt and/or family verbalized understanding of poc and discharge instructions. Reviewed admission and on going care with receiving RN. Pt is in NAD at this time and is ready to be transferred to floor. Will con't to monitor until pt is transferred.  Pt on Monitor  

## 2020-09-13 NOTE — Transfer of Care (Signed)
Immediate Anesthesia Transfer of Care Note  Patient: Joanne Nelson  Procedure(s) Performed: INTRAMEDULLARY (IM) NAIL FEMORAL (Left Leg Upper)  Patient Location: PACU  Anesthesia Type:General  Level of Consciousness: awake, alert  and oriented  Airway & Oxygen Therapy: Patient Spontanous Breathing and Patient connected to face mask oxygen  Post-op Assessment: Report given to RN and Post -op Vital signs reviewed and stable  Post vital signs: Reviewed and stable  Last Vitals:  Vitals Value Taken Time  BP    Temp    Pulse    Resp    SpO2      Last Pain:  Vitals:   09/13/20 1032  TempSrc: Oral  PainSc:          Complications: No complications documented.

## 2020-09-13 NOTE — Progress Notes (Signed)
Orthopedic Tech Progress Note Patient Details:  Joanne Nelson 19-Sep-2000 [id number]  Musculoskeletal Traction Type of Traction: Bucks Skin Traction Traction Location: lle Traction Weight: 10 lbs   Post Interventions Patient Tolerated: Well Instructions Provided: Care of device,Adjustment of device   Trinna Post 09/13/2020, 3:47 AM

## 2020-09-13 NOTE — Progress Notes (Signed)
OT Cancellation Note  Patient Details Name: Sunita Demond MRN: [id number] DOB: [birth year]   Cancelled Treatment:    Reason Eval/Treat Not Completed: Other (comment) (Eval to start 12/28. In buckstraction this AM. Thank you.)  Haylen Bellotti M Yunior Jain Annalyce Lanpher MSOT, OTR/L Acute Rehab Pager: [phone] Office: [phone] 09/13/2020, 9:10 AM

## 2020-09-13 NOTE — OR Nursing (Signed)
Lab at bedside

## 2020-09-13 NOTE — ED Notes (Signed)
Offered to place purewick on pt, pt stated she may want a purewick later, but presently does not want one placed, or feel the need to urinate.

## 2020-09-13 NOTE — ED Notes (Signed)
Dr. Carlena Hurl paged to ask about potassium level

## 2020-09-13 NOTE — ED Notes (Signed)
Ordered a hospital bed 

## 2020-09-13 NOTE — ED Provider Notes (Signed)
Buck's traction 10 pounds applied.   Dione Booze, MD 09/13/20 [phone]

## 2020-09-13 NOTE — H&P (Signed)
I have reviewed images. Need left femur dedicated films.   Tentative plan: X-ray L femur Or Monday for IM nail    Formal c/s to follow

## 2020-09-13 NOTE — ED Notes (Signed)
PA notified of k of 2.7.

## 2020-09-13 NOTE — Anesthesia Procedure Notes (Signed)
Procedure Name: Intubation Date/Time: 09/13/2020 2:30 PM Performed by: Griffin Dakin, CRNA Pre-anesthesia Checklist: Patient identified, Emergency Drugs available, Suction available and Patient being monitored Patient Re-evaluated:Patient Re-evaluated prior to induction Oxygen Delivery Method: Circle system utilized Preoxygenation: Pre-oxygenation with 100% oxygen Induction Type: IV induction Ventilation: Mask ventilation without difficulty Laryngoscope Size: Mac and 3 Grade View: Grade I Tube type: Oral Tube size: 7.0 mm Number of attempts: 1 Airway Equipment and Method: Stylet and Oral airway Placement Confirmation: ETT inserted through vocal cords under direct vision,  positive ETCO2 and breath sounds checked- equal and bilateral Secured at: 22 cm Tube secured with: Tape Dental Injury: Teeth and Oropharynx as per pre-operative assessment

## 2020-09-14 ENCOUNTER — Encounter (HOSPITAL_COMMUNITY): Payer: Self-pay

## 2020-09-14 ENCOUNTER — Inpatient Hospital Stay (HOSPITAL_COMMUNITY): Payer: BC Managed Care – PPO

## 2020-09-14 ENCOUNTER — Other Ambulatory Visit: Payer: Self-pay

## 2020-09-14 LAB — BASIC METABOLIC PANEL
Anion gap: 9 (ref 5–15)
BUN: 7 mg/dL (ref 6–20)
CO2: 23 mmol/L (ref 22–32)
Calcium: 8 mg/dL — ABNORMAL LOW (ref 8.9–10.3)
Chloride: 102 mmol/L (ref 98–111)
Creatinine, Ser: 0.95 mg/dL (ref 0.44–1.00)
GFR, Estimated: >60 mL/min (ref >60)
Glucose, Bld: 120 mg/dL — ABNORMAL HIGH (ref 70–99)
Potassium: 3.8 mmol/L (ref 3.5–5.1)
Sodium: 134 mmol/L — ABNORMAL LOW (ref 135–145)

## 2020-09-14 LAB — CBC
HCT: 20.3 % — ABNORMAL LOW (ref 36.0–46.0)
HCT: 27.9 % — ABNORMAL LOW (ref 36.0–46.0)
Hemoglobin: 6.5 g/dL — CL (ref 12.0–15.0)
Hemoglobin: 9.7 g/dL — ABNORMAL LOW (ref 12.0–15.0)
MCH: 29.4 pg (ref 26.0–34.0)
MCH: 29.8 pg (ref 26.0–34.0)
MCHC: 32 g/dL (ref 30.0–36.0)
MCHC: 34.8 g/dL (ref 30.0–36.0)
MCV: 91.9 fL (ref 80.0–100.0)
MCV: 85.6 fL (ref 80.0–100.0)
Platelets: 223 10*3/uL (ref 150–400)
Platelets: 210 10*3/uL (ref 150–400)
RBC: 2.21 MIL/uL — ABNORMAL LOW (ref 3.87–5.11)
RBC: 3.26 MIL/uL — ABNORMAL LOW (ref 3.87–5.11)
RDW: 12.9 % (ref 11.5–15.5)
RDW: 13.7 % (ref 11.5–15.5)
WBC: 6.5 10*3/uL (ref 4.0–10.5)
WBC: 8.2 10*3/uL (ref 4.0–10.5)
nRBC: 0 % (ref 0.0–0.2)
nRBC: 0 % (ref 0.0–0.2)

## 2020-09-14 LAB — URINALYSIS, ROUTINE W REFLEX MICROSCOPIC
Bacteria, UA: NONE SEEN
Bilirubin Urine: NEGATIVE
Glucose, UA: NEGATIVE mg/dL
Ketones, ur: NEGATIVE mg/dL
Leukocytes,Ua: NEGATIVE
Nitrite: NEGATIVE
Protein, ur: 30 mg/dL — AB
Specific Gravity, Urine: 1.04 — ABNORMAL HIGH (ref 1.005–1.030)
pH: 5 (ref 5.0–8.0)

## 2020-09-14 LAB — PREPARE RBC (CROSSMATCH)

## 2020-09-14 MED ORDER — SODIUM CHLORIDE 0.9% IV SOLUTION: Freq: Once | INTRAVENOUS | Status: AC

## 2020-09-14 NOTE — Plan of Care (Signed)
  Problem: Education: Goal: Knowledge of General Education information will improve Description: Including pain rating scale, medication(s)/side effects and non-pharmacologic comfort measures Outcome: Progressing   Problem: Health Behavior/Discharge Planning: Goal: Ability to manage health-related needs will improve Outcome: Progressing   Problem: Clinical Measurements: Goal: Ability to maintain clinical measurements within normal limits will improve Outcome: Progressing Goal: Will remain free from infection Outcome: Progressing Goal: Diagnostic test results will improve Outcome: Progressing Goal: Respiratory complications will improve Outcome: Progressing Goal: Cardiovascular complication will be avoided Outcome: Progressing   Problem: Activity: Goal: Risk for activity intolerance will decrease Outcome: Progressing   Problem: Nutrition: Goal: Adequate nutrition will be maintained Outcome: Progressing   Problem: Coping: Goal: Level of anxiety will decrease Outcome: Progressing   Problem: Elimination: Goal: Will not experience complications related to bowel motility Outcome: Progressing Goal: Will not experience complications related to urinary retention Outcome: Progressing   Problem: Pain Managment: Goal: General experience of comfort will improve Outcome: Progressing   Problem: Safety: Goal: Ability to remain free from injury will improve Outcome: Progressing   Problem: Skin Integrity: Goal: Risk for impaired skin integrity will decrease Outcome: Progressing   Problem: Activity: Goal: Ability to avoid complications of mobility impairment will improve Outcome: Progressing Goal: Ability to tolerate increased activity will improve Outcome: Progressing   Problem: Education: Goal: Verbalization of understanding the information provided will improve Outcome: Progressing   Problem: Coping: Goal: Level of anxiety will decrease Outcome: Progressing   Problem:  Physical Regulation: Goal: Postoperative complications will be avoided or minimized Outcome: Progressing   Problem: Respiratory: Goal: Ability to maintain a clear airway will improve Outcome: Progressing   Problem: Pain Management: Goal: Pain level will decrease Outcome: Progressing   Problem: Skin Integrity: Goal: Signs of wound healing will improve Outcome: Progressing   Problem: Tissue Perfusion: Goal: Ability to maintain adequate tissue perfusion will improve Outcome: Progressing   

## 2020-09-14 NOTE — Progress Notes (Signed)
    Subjective: Patient is sleeping comfortably. She has a unit of PRBCs hung and transfusing into her. I spoke with her mother in the room who stated that the patient reports pain as mild to moderate. She said [facility] told her that she is "feeling much better now and can even move her ankle and foot better than before surgery."  Tolerating diet. Wants to get up to use the restroom rather than a bedside commode. Not yet mobilized. PT/OT should be coming to see patient today to help her learn how to move about with crutches and do toe touch WBAT. Denies CP, SOB. No weakness/dizziness  Objective:   VITALS:   Vitals:   09/14/20 0350 09/14/20 0615 09/14/20 0644 09/14/20 0651  BP: 108/76 124/60 (!) 104/56 (!) 107/56  Pulse:  83 78 86  Resp: 16 15 16 16   Temp: 98.5 F (36.9 C) 98.1 F (36.7 C)  97.6 F (36.4 C)  TempSrc: Oral Oral  Oral  SpO2: 98% 96% 98% 98%  Weight:      Height:       CBC Latest Ref Rng & Units 09/14/2020 09/13/2020 09/13/2020  WBC 4.0 - 10.5 K/uL 6.5 8.0 8.0  Hemoglobin 12.0 - 15.0 g/dL 6.5(LL) 7.2(L) 8.6(L)  Hematocrit 36.0 - 46.0 % 20.3(L) 22.3(L) 26.5(L)  Platelets 150 - 400 K/uL 223 254 297   BMP Latest Ref Rng & Units 09/14/2020 09/13/2020 09/12/2020  Glucose 70 - 99 mg/dL 09/14/2020) 696(E) 952(W)  BUN 6 - 20 mg/dL 7 11 13   Creatinine 0.44 - 1.00 mg/dL 413(K 4.40)  Sodium 135 - 145 mmol/L 134(L) 136 141  Potassium 3.5 - 5.1 mmol/L 3.8 3.9 2.7(LL)  Chloride 98 - 111 mmol/L 102 103 104  CO2 22 - 32 mmol/L 23 24 -  Calcium 8.9 - 10.3 mg/dL 8.0(L) 8.2(L) -   Intake/Output      12/27 0701 12/28 0700 12/28 0701 12/29 0700   P.O. 120 240   I.V. (mL/kg) 1979.8 (31.1)    Blood 310    IV Piggyback 596.5    Total Intake(mL/kg) 3006.3 (47.3) 240 (3.8)   Urine (mL/kg/hr) 2099 (1.4)    Blood 100    Total Output 2199    Net +807.3 +240        Urine Occurrence 1 x        Physical Exam: General: NAD. Sleeping in bed.  Calm. No increased work of  breathing. MSK Neurovascularly intact Sensation intact distally Intact pulses distally Dorsiflexion/Plantar flexion intact Incisions: left thigh, left knee, and left small finger all covered with dressings that are c/d/i. Knee immobilizer in place   Assessment: 1 Day Post-Op  S/P Procedure(s) (LRB): INTRAMEDULLARY (IM) NAIL FEMORAL (Left) by Dr. 2100. Murphy on 09/13/20  Active Problems:   Femur fracture (HCC)  Closed left femur fractures, status post IM nail Doing well postop day 1 Tolerating diet and voiding Pain controlled Not yet mobilized  Plan: Up with therapy Incentive Spirometry Apply ice PRN H&H down to 6.5 this morning. Received another unit of blood. Another CBC ordered   Weightbearing: TDWB LLE Insicional and dressing care: Dressings left intact Showering: Keep dressing dry VTE prophylaxis:  Lovenox 30mg  q12h, SCDs Pain control: Continue current regimen Contact information:  Jewel Baize MD, 09/15/20 PA-C  Dispo: TBD.  Therapy evaluations pending.  Discharge when mobilized and ready medically.   , PA-C 09/14/2020, 11:41 AM
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
## 2020-09-14 NOTE — TOC CAGE-AID Note (Signed)
Transition of Care [facility]) - CAGE-AID Screening   Patient Details  Name: Joanne Nelson MRN: [id number] Date of Birth: 05-Apr-2001   Clinical Narrative: Patient endorses social drinking. She denies being criticized for feeling guilty of her drinking. She also denies drinking to help calm her nerves. Patient states she does not need for resources at this time.    CAGE-AID Screening:    Have You Ever Felt You Ought to Cut Down on Your Drinking or Drug Use?: No Have People Annoyed You By Critizing Your Drinking Or Drug Use?: No Have You Felt Bad Or Guilty About Your Drinking Or Drug Use?: No Have You Ever Had a Drink or Used Drugs First Thing In The Morning to Steady Your Nerves or to Get Rid of a Hangover?: No CAGE-AID Score: 0

## 2020-09-14 NOTE — Anesthesia Postprocedure Evaluation (Signed)
Anesthesia Post Note  Patient: Joanne Nelson  Procedure(s) Performed: INTRAMEDULLARY (IM) NAIL FEMORAL (Left Leg Upper)     Patient location during evaluation: PACU Anesthesia Type: General Level of consciousness: sedated Pain management: pain level controlled Vital Signs Assessment: post-procedure vital signs reviewed and stable Respiratory status: spontaneous breathing and respiratory function stable Cardiovascular status: stable Postop Assessment: no apparent nausea or vomiting Anesthetic complications: no   No complications documented.                Aubrey Blackard,Neuharth DANIEL
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
## 2020-09-15 ENCOUNTER — Inpatient Hospital Stay (HOSPITAL_COMMUNITY): Payer: BC Managed Care – PPO

## 2020-09-15 LAB — CBC
HCT: 25.4 % — ABNORMAL LOW (ref 36.0–46.0)
Hemoglobin: 8.3 g/dL — ABNORMAL LOW (ref 12.0–15.0)
MCH: 29 pg (ref 26.0–34.0)
MCHC: 32.7 g/dL (ref 30.0–36.0)
MCV: 88.8 fL (ref 80.0–100.0)
Platelets: 195 10*3/uL (ref 150–400)
RBC: 2.86 MIL/uL — ABNORMAL LOW (ref 3.87–5.11)
RDW: 13.9 % (ref 11.5–15.5)
WBC: 8.1 10*3/uL (ref 4.0–10.5)
nRBC: 0 % (ref 0.0–0.2)

## 2020-09-15 LAB — TYPE AND SCREEN
ABO/RH(D): A POS
Antibody Screen: NEGATIVE
Unit division: 0
Unit division: 0

## 2020-09-15 LAB — BPAM RBC
Blood Product Expiration Date: 202201202359
Blood Product Expiration Date: 202201202359
ISSUE DATE / TIME: 202112280327
ISSUE DATE / TIME: 202112280626
Unit Type and Rh: 6200
Unit Type and Rh: 6200

## 2020-09-15 NOTE — Plan of Care (Signed)
  Problem: Safety: Goal: Ability to remain free from injury will improve Outcome: Progressing   Problem: Pain Managment: Goal: General experience of comfort will improve Outcome: Progressing   Problem: Skin Integrity: Goal: Risk for impaired skin integrity will decrease Outcome: Progressing   
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
## 2020-09-15 NOTE — Discharge Instructions (Addendum)
Weightbearing: Touch down weight bearing left lower extremity Incisional and dressing care: Dressings left intact Showering: Keep dressing dry Follow up: with Dr. Greig Right office in 1 week following d/c    RIB FRACTURES  HOME INSTRUCTIONS   1. PAIN CONTROL:  1. Pain is best controlled by a usual combination of three different methods TOGETHER:  i. Ice/Heat ii. Over the counter pain medication iii. Prescription pain medication 2. You may experience some swelling and bruising in area of broken ribs. Ice packs or heating pads (30-60 minutes up to 6 times a day) will help. Use ice for the first few days to help decrease swelling and bruising, then switch to heat to help relax tight/sore spots and speed recovery. Some people prefer to use ice alone, heat alone, alternating between ice & heat. Experiment to what works for you. Swelling and bruising can take several weeks to resolve.  3. It is helpful to take an over-the-counter pain medication regularly for the first few weeks. Choose one of the following that works best for you:  i. Naproxen (Aleve, etc) Two 220mg  tabs twice a day ii. Ibuprofen (Advil, etc) Three 200mg  tabs four times a day (every meal & bedtime) iii. Acetaminophen (Tylenol, etc) 500-650mg  four times a day (every meal & bedtime) 4. A prescription for pain medication (such as oxycodone, hydrocodone, etc) may be given to you upon discharge. Take your pain medication as prescribed.  i. If you are having problems/concerns with the prescription medicine (does not control pain, nausea, vomiting, rash, itching, etc), please call [phone] to see if we need to switch you to a different pain medicine that will work better for you and/or control your side effect better. ii. If you need a refill on your pain medication, please contact your pharmacy. They will contact our office to request authorization. Prescriptions will not be filled after 5 pm or on week-ends. 1. Avoid getting  constipated. When taking pain medications, it is common to experience some constipation. Increasing fluid intake and taking a fiber supplement (such as Metamucil, Citrucel, FiberCon, MiraLax, etc) 1-2 times a day regularly will usually help prevent this problem from occurring. A mild laxative (prune juice, Milk of Magnesia, MiraLax, etc) should be taken according to package directions if there are no bowel movements after 48 hours.  2. Watch out for diarrhea. If you have many loose bowel movements, simplify your diet to bland foods & liquids for a few days. Stop any stool softeners and decrease your fiber supplement. Switching to mild anti-diarrheal medications (Kayopectate, Pepto Bismol) can help. If this worsens or does not improve, please call us. 3. FOLLOW UP  a. If a follow up appointment is needed one will be scheduled for you. If none is needed with our trauma team, please follow up with your primary care provider within 2-3 weeks from discharge. Please call [facility] at [phone] if you have any questions about follow up.  b. If you have any orthopedic or other injuries you will need to follow up as outlined in your follow up instructions.   WHEN TO CALL us [phone]:  1. Poor pain control 2. Reactions / problems with new medications (rash/itching, nausea, etc)  3. Fever over 101.5 F (38.5 C) 4. Worsening swelling or bruising 5. Worsening pain, productive cough, difficulty breathing or any other concerning symptoms  The clinic staff is available to answer your questions during regular business hours (8:30am-5pm). Please don't hesitate to call and ask to speak to one  of our nurses for clinical concerns.  If you have a medical emergency, go to the nearest emergency room or call 911.  A surgeon from [facility] is always on call at the [facility], [place]  [address], [place], [place] [address] ?  MAIN: [phone] ? TOLL FREE:  [phone] ?  FAX [phone]  [email or web address]      Information on Rib Fractures  A rib fracture is a break or crack in one of the bones of the ribs. The ribs are long, curved bones that wrap around your chest and attach to your spine and your breastbone. The ribs protect your heart, lungs, and other organs in the chest. A broken or cracked rib is often painful but is not usually serious. Most rib fractures heal on their own over time. However, rib fractures can be more serious if multiple ribs are broken or if broken ribs move out of place and push against other structures or organs. What are the causes? This condition is caused by:  Repetitive movements with high force, such as pitching a baseball or having severe coughing spells.  A direct blow to the chest, such as a sports injury, a car accident, or a fall.  Cancer that has spread to the bones, which can weaken bones and cause them to break. What are the signs or symptoms? Symptoms of this condition include:  Pain when you breathe in or cough.  Pain when someone presses on the injured area.  Feeling short of breath. How is this diagnosed? This condition is diagnosed with a physical exam and medical history. Imaging tests may also be done, such as:  Chest X-ray.  CT scan.  MRI.  Bone scan.  Chest ultrasound. How is this treated? Treatment for this condition depends on the severity of the fracture. Most rib fractures usually heal on their own in 1-3 months. Sometimes healing takes longer if there is a cough that does not stop or if there are other activities that make the injury worse (aggravating factors). While you heal, you will be given medicines to control the pain. You will also be taught deep breathing exercises. Severe injuries may require hospitalization or surgery. Follow these instructions at home: Managing pain, stiffness, and swelling  If directed, apply ice to the injured  area. ? Put ice in a plastic bag. ? Place a towel between your skin and the bag. ? Leave the ice on for 20 minutes, 2-3 times a day.  Take over-the-counter and prescription medicines only as told by your health care provider. Activity  Avoid a lot of activity and any activities or movements that cause pain. Be careful during activities and avoid bumping the injured rib.  Slowly increase your activity as told by your health care provider. General instructions  Do deep breathing exercises as told by your health care provider. This helps prevent pneumonia, which is a common complication of a broken rib. Your health care provider may instruct you to: ? Take deep breaths several times a day. ? Try to cough several times a day, holding a pillow against the injured area. ? Use a device called incentive spirometer to practice deep breathing several times a day.  Drink enough fluid to keep your urine pale yellow.  Do not wear a rib belt or binder. These restrict breathing, which can lead to pneumonia.  Keep all follow-up visits as told by your health care provider. This is important. Contact  a health care provider if:  You have a fever. Get help right away if:  You have difficulty breathing or you are short of breath.  You develop a cough that does not stop, or you cough up thick or bloody sputum.  You have nausea, vomiting, or pain in your abdomen.  Your pain gets worse and medicine does not help. Summary  A rib fracture is a break or crack in one of the bones of the ribs.  A broken or cracked rib is often painful but is not usually serious.  Most rib fractures heal on their own over time.  Treatment for this condition depends on the severity of the fracture.  Avoid a lot of activity and any activities or movements that cause pain. This information is not intended to replace advice given to you by your health care provider. Make sure you discuss any questions you have with your  health care provider. Document Released: 09/04/2005 Document Revised: 12/04/2016 Document Reviewed: 12/04/2016 Elsevier Interactive Patient Education  2019 [employer].
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
## 2020-09-16 ENCOUNTER — Encounter (HOSPITAL_COMMUNITY): Payer: Self-pay | Admitting: Orthopedic Surgery

## 2020-09-16 LAB — CBC
HCT: 23.9 % — ABNORMAL LOW (ref 36.0–46.0)
Hemoglobin: 7.9 g/dL — ABNORMAL LOW (ref 12.0–15.0)
MCH: 29.2 pg (ref 26.0–34.0)
MCHC: 33.1 g/dL (ref 30.0–36.0)
MCV: 88.2 fL (ref 80.0–100.0)
Platelets: 216 10*3/uL (ref 150–400)
RBC: 2.71 MIL/uL — ABNORMAL LOW (ref 3.87–5.11)
RDW: 13.5 % (ref 11.5–15.5)
WBC: 7.6 10*3/uL (ref 4.0–10.5)
nRBC: 0 % (ref 0.0–0.2)

## 2020-09-16 MED ORDER — OXYCODONE HCL 5 MG PO TABS: 5.0000 mg | ORAL_TABLET | Freq: Four times a day (QID) | ORAL | 0 refills | Status: DC | PRN

## 2020-09-16 MED ORDER — MORPHINE SULFATE (PF) 2 MG/ML IV SOLN: 2.0000 mg | INTRAVENOUS | Status: DC | PRN

## 2020-09-16 MED ORDER — OXYCODONE HCL 5 MG PO TABS: 5.0000 mg | ORAL_TABLET | ORAL | Status: DC | PRN

## 2020-09-16 MED ORDER — COVID-19 MRNA VACC (MODERNA) 50 MCG/0.25ML IM SUSP
0.2500 mL | Freq: Once | INTRAMUSCULAR | Status: AC
  Administered 2020-09-16: 0.25 mL via INTRAMUSCULAR
  Filled 2020-09-16: qty 0.25

## 2020-09-16 MED ORDER — TAB-A-VITE/IRON PO TABS: 1.0000 | ORAL_TABLET | Freq: Every day | ORAL | 0 refills | Status: DC

## 2020-09-16 MED ORDER — ACETAMINOPHEN 500 MG PO TABS: 1000.0000 mg | ORAL_TABLET | Freq: Four times a day (QID) | ORAL | 0 refills | Status: AC | PRN

## 2020-09-16 MED ORDER — POLYETHYLENE GLYCOL 3350 17 G PO PACK
17.0000 g | PACK | Freq: Every day | ORAL | Status: DC
  Administered 2020-09-16: 17 g via ORAL
  Filled 2020-09-16: qty 1

## 2020-09-16 MED ORDER — TAB-A-VITE/IRON PO TABS
1.0000 | ORAL_TABLET | Freq: Every day | ORAL | Status: DC
  Administered 2020-09-16: 1 via ORAL
  Filled 2020-09-16: qty 1

## 2020-09-16 MED ORDER — ENOXAPARIN SODIUM 40 MG/0.4ML ~~LOC~~ SOLN: 40.0000 mg | SUBCUTANEOUS | 0 refills | Status: DC

## 2020-09-16 MED ORDER — POLYETHYLENE GLYCOL 3350 17 G PO PACK: 17.0000 g | PACK | Freq: Every day | ORAL | 0 refills | Status: DC | PRN

## 2020-09-16 NOTE — Progress Notes (Signed)
Pt was ambulating to the bathroom with walker. LLE with knee immobilizer on ay all times. Dressing to left knee and left thigh dry and intact. Abrasions to face dry. Pt instructed how to do Lovenox injections, verbalized understanding. Discharge instructions given. Discharged to home.
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
## 2020-09-16 NOTE — Plan of Care (Signed)
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
## 2020-09-16 NOTE — TOC Transition Note (Signed)
Transition of Care [facility]) - CM/SW Discharge Note   Patient Details  Name: Joanne Nelson MRN: [id number] Date of Birth: 06/30/2001  [facility] CM/SW Contact:  Glennon Mac, RN Phone Number: 09/16/2020, 4:02 PM   Clinical Narrative:  Pt discharging home today with mother to assist with care.  DME has been delivered to bedside as requested.  No other discharge needs identified.    Final next level of care: Home/Self Care Barriers to Discharge: Barriers Resolved            Discharge Plan and Services   Discharge Planning Services: CM Consult            DME Arranged: 3-N-1,Walker rolling DME Agency: [employer] Date DME Agency Contacted: 09/14/20 Time DME Agency Contacted: 1630 Representative spoke with at DME Agency: Velna Hatchet            Social Determinants of Health (SDOH) Interventions     Readmission Risk Interventions No flowsheet data found.  Quintella Baton, RN, BSN  Trauma/Neuro ICU Case Manager [phone]
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
## 2020-10-01 ENCOUNTER — Other Ambulatory Visit: Payer: Self-pay

## 2020-10-01 ENCOUNTER — Telehealth (INDEPENDENT_AMBULATORY_CARE_PROVIDER_SITE_OTHER): Payer: BC Managed Care – PPO | Admitting: Physician Assistant

## 2020-10-01 ENCOUNTER — Encounter: Payer: Self-pay | Admitting: Physician Assistant

## 2020-10-01 DIAGNOSIS — S2242XD Multiple fractures of ribs, left side, subsequent encounter for fracture with routine healing: Secondary | ICD-10-CM | POA: Diagnosis not present

## 2020-10-01 DIAGNOSIS — S728X2D Other fracture of left femur, subsequent encounter for closed fracture with routine healing: Secondary | ICD-10-CM
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
## 2020-10-01 NOTE — Progress Notes (Signed)
I have discussed the procedure for the virtual visit with the patient who has given consent to proceed with assessment and treatment.   Kaelee Pfeffer S Hicks Feick, CMA     

## 2021-02-16 ENCOUNTER — Other Ambulatory Visit: Payer: Self-pay | Admitting: Orthopedic Surgery

## 2021-02-16 DIAGNOSIS — M898X5 Other specified disorders of bone, thigh: Secondary | ICD-10-CM

## 2021-02-24 ENCOUNTER — Other Ambulatory Visit: Payer: Self-pay

## 2021-02-24 ENCOUNTER — Ambulatory Visit
Admission: RE | Admit: 2021-02-24 | Discharge: 2021-02-24 | Disposition: A | Discharge status: Home / Self Care | Payer: BC Managed Care – PPO | Source: Ambulatory Visit | Attending: Orthopedic Surgery | Admitting: Orthopedic Surgery

## 2021-02-24 DIAGNOSIS — M898X5 Other specified disorders of bone, thigh: Secondary | ICD-10-CM
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
## 2021-02-25 ENCOUNTER — Encounter (HOSPITAL_BASED_OUTPATIENT_CLINIC_OR_DEPARTMENT_OTHER): Payer: Self-pay | Admitting: Orthopedic Surgery

## 2021-02-25 ENCOUNTER — Other Ambulatory Visit (HOSPITAL_COMMUNITY)
Admission: RE | Admit: 2021-02-25 | Discharge: 2021-02-25 | Disposition: A | Discharge status: Home / Self Care | Payer: BC Managed Care – PPO | Source: Ambulatory Visit | Attending: Orthopedic Surgery | Admitting: Orthopedic Surgery

## 2021-02-25 ENCOUNTER — Other Ambulatory Visit: Payer: Self-pay

## 2021-02-25 DIAGNOSIS — Z01812 Encounter for preprocedural laboratory examination: Secondary | ICD-10-CM | POA: Insufficient documentation

## 2021-02-25 DIAGNOSIS — Z20822 Contact with and (suspected) exposure to covid-19: Secondary | ICD-10-CM | POA: Diagnosis not present

## 2021-02-25 LAB — SARS CORONAVIRUS 2 (TAT 6-24 HRS): SARS Coronavirus 2: NEGATIVE

## 2021-02-25 NOTE — Progress Notes (Signed)
Spoke w/ via phone for pre-op interview--- Pt Lab needs dos----  Urine preg             Lab results------ no COVID test --- pt had test 02-25-2021 result in epic Arrive at ------- 0615 on 03-01-2021 NPO after MN NO Solid Food.  Clear liquids from MN until--- 0515 Med rec completed Medications to take morning of surgery ----- NONE Diabetic medication ----- Patient instructed no nail polish to be worn day of surgery Patient instructed to bring photo id and insurance card day of surgery Patient aware to have Driver (ride ) / caregiver for 24 hours after surgery -- mother, Darreld Mclean Patient Special Instructions ----- Pre-Op special Istructions ----- Patient verbalized understanding of instructions that were given at this phone interview. Patient denies shortness of breath, chest pain, fever, cough at this phone interview.

## 2021-02-28 NOTE — Anesthesia Preprocedure Evaluation (Addendum)
Anesthesia Evaluation  Patient identified by MRN, date of birth, ID band Patient awake    Reviewed: Allergy & Precautions, NPO status , Patient's Chart, lab work & pertinent test results  History of Anesthesia Complications Negative for: history of anesthetic complications  Airway Mallampati: I  TM Distance: >3 FB Neck ROM: Full    Dental  (+) Teeth Intact, Dental Advisory Given   Pulmonary neg pulmonary ROS,  02/25/2021 SARS coronavirus NEG   breath sounds clear to auscultation       Cardiovascular (-) anginanegative cardio ROS   Rhythm:Regular Rate:Normal     Neuro/Psych negative neurological ROS     GI/Hepatic negative GI ROS, Neg liver ROS,   Endo/Other  negative endocrine ROS  Renal/GU negative Renal ROS     Musculoskeletal   Abdominal   Peds  Hematology negative hematology ROS (+)   Anesthesia Other Findings   Reproductive/Obstetrics negative OB ROS                            Anesthesia Physical Anesthesia Plan  ASA: 1  Anesthesia Plan: General   Post-op Pain Management:    Induction: Intravenous  PONV Risk Score and Plan: 3 and Ondansetron, Dexamethasone and Scopolamine patch - Pre-op  Airway Management Planned: LMA  Additional Equipment: None  Intra-op Plan:   Post-operative Plan:   Informed Consent: I have reviewed the patients History and Physical, chart, labs and discussed the procedure including the risks, benefits and alternatives for the proposed anesthesia with the patient or authorized representative who has indicated his/her understanding and acceptance.     Dental advisory given  Plan Discussed with: CRNA and Surgeon  Anesthesia Plan Comments:        Anesthesia Quick Evaluation

## 2021-03-01 ENCOUNTER — Encounter (HOSPITAL_BASED_OUTPATIENT_CLINIC_OR_DEPARTMENT_OTHER)
Admission: RE | Disposition: A | Discharge status: Home / Self Care | Payer: Self-pay | Source: Home / Self Care | Attending: Orthopedic Surgery

## 2021-03-01 ENCOUNTER — Other Ambulatory Visit: Payer: Self-pay

## 2021-03-01 ENCOUNTER — Encounter (HOSPITAL_BASED_OUTPATIENT_CLINIC_OR_DEPARTMENT_OTHER): Payer: Self-pay | Admitting: Orthopedic Surgery

## 2021-03-01 ENCOUNTER — Ambulatory Visit (HOSPITAL_BASED_OUTPATIENT_CLINIC_OR_DEPARTMENT_OTHER): Payer: BC Managed Care – PPO | Admitting: Anesthesiology

## 2021-03-01 ENCOUNTER — Ambulatory Visit (HOSPITAL_BASED_OUTPATIENT_CLINIC_OR_DEPARTMENT_OTHER)
Admission: RE | Admit: 2021-03-01 | Discharge: 2021-03-01 | Disposition: A | Discharge status: Home / Self Care | Payer: BC Managed Care – PPO | Attending: Orthopedic Surgery | Admitting: Orthopedic Surgery

## 2021-03-01 DIAGNOSIS — Z7901 Long term (current) use of anticoagulants: Secondary | ICD-10-CM | POA: Insufficient documentation

## 2021-03-01 DIAGNOSIS — Y831 Surgical operation with implant of artificial internal device as the cause of abnormal reaction of the patient, or of later complication, without mention of misadventure at the time of the procedure: Secondary | ICD-10-CM | POA: Insufficient documentation

## 2021-03-01 DIAGNOSIS — T8484XA Pain due to internal orthopedic prosthetic devices, implants and grafts, initial encounter: Secondary | ICD-10-CM | POA: Diagnosis not present

## 2021-03-01 DIAGNOSIS — M24662 Ankylosis, left knee: Secondary | ICD-10-CM | POA: Diagnosis not present

## 2021-03-01 DIAGNOSIS — Z9889 Other specified postprocedural states: Secondary | ICD-10-CM

## 2021-03-01 HISTORY — PX: HARDWARE REMOVAL: SHX979

## 2021-03-01 HISTORY — DX: Presence of spectacles and contact lenses: Z97.3

## 2021-03-01 HISTORY — DX: Stiffness of unspecified joint, not elsewhere classified: M25.60

## 2021-03-01 HISTORY — DX: Pain in left knee: M25.562

## 2021-03-01 LAB — POCT PREGNANCY, URINE: Preg Test, Ur: NEGATIVE

## 2021-03-01 SURGERY — REMOVAL, HARDWARE
Anesthesia: General | Site: Knee | Laterality: Left

## 2021-03-01 MED ORDER — MIDAZOLAM HCL 2 MG/2ML IJ SOLN
INTRAMUSCULAR | Status: AC
  Filled 2021-03-01: qty 2

## 2021-03-01 MED ORDER — OXYCODONE HCL 5 MG PO TABS: 5.0000 mg | ORAL_TABLET | Freq: Once | ORAL | Status: DC | PRN

## 2021-03-01 MED ORDER — POVIDONE-IODINE 10 % EX SWAB: 2.0000 "application " | Freq: Once | CUTANEOUS | Status: DC

## 2021-03-01 MED ORDER — BUPIVACAINE HCL (PF) 0.25 % IJ SOLN
INTRAMUSCULAR | Status: DC | PRN
  Administered 2021-03-01: 10 mL

## 2021-03-01 MED ORDER — HYDROCODONE-ACETAMINOPHEN 10-325 MG PO TABS: 1.0000 | ORAL_TABLET | Freq: Four times a day (QID) | ORAL | 0 refills | Status: AC | PRN

## 2021-03-01 MED ORDER — MIDAZOLAM HCL 5 MG/5ML IJ SOLN
INTRAMUSCULAR | Status: DC | PRN
  Administered 2021-03-01: 2 mg via INTRAVENOUS

## 2021-03-01 MED ORDER — MIDAZOLAM HCL 2 MG/2ML IJ SOLN: 0.5000 mg | Freq: Once | INTRAMUSCULAR | Status: DC | PRN

## 2021-03-01 MED ORDER — FENTANYL CITRATE (PF) 100 MCG/2ML IJ SOLN
INTRAMUSCULAR | Status: AC
  Filled 2021-03-01: qty 2

## 2021-03-01 MED ORDER — DEXAMETHASONE SODIUM PHOSPHATE 4 MG/ML IJ SOLN
INTRAMUSCULAR | Status: DC | PRN
  Administered 2021-03-01: 10 mg via INTRAVENOUS
  Administered 2021-03-01: 2 mg via INTRAVENOUS

## 2021-03-01 MED ORDER — ACETAMINOPHEN 500 MG PO TABS
ORAL_TABLET | ORAL | Status: AC
  Filled 2021-03-01: qty 2

## 2021-03-01 MED ORDER — CEFAZOLIN SODIUM-DEXTROSE 2-4 GM/100ML-% IV SOLN
INTRAVENOUS | Status: AC
  Filled 2021-03-01: qty 100

## 2021-03-01 MED ORDER — MEPERIDINE HCL 25 MG/ML IJ SOLN
6.2500 mg | INTRAMUSCULAR | Status: DC | PRN
Start: 2021-03-01 — End: 2021-03-01

## 2021-03-01 MED ORDER — DEXAMETHASONE SODIUM PHOSPHATE 10 MG/ML IJ SOLN
INTRAMUSCULAR | Status: AC
  Filled 2021-03-01: qty 1

## 2021-03-01 MED ORDER — FENTANYL CITRATE (PF) 100 MCG/2ML IJ SOLN
INTRAMUSCULAR | Status: DC | PRN
  Administered 2021-03-01: 25 ug via INTRAVENOUS
  Administered 2021-03-01 (×3): 50 ug via INTRAVENOUS
  Administered 2021-03-01: 25 ug via INTRAVENOUS

## 2021-03-01 MED ORDER — DEXAMETHASONE SODIUM PHOSPHATE 10 MG/ML IJ SOLN: 8.0000 mg | Freq: Once | INTRAMUSCULAR | Status: DC

## 2021-03-01 MED ORDER — ACETAMINOPHEN 500 MG PO TABS
1000.0000 mg | ORAL_TABLET | Freq: Once | ORAL | Status: AC
  Administered 2021-03-01: 1000 mg via ORAL

## 2021-03-01 MED ORDER — KETOROLAC TROMETHAMINE 30 MG/ML IJ SOLN
INTRAMUSCULAR | Status: DC | PRN
  Administered 2021-03-01: 30 mg via INTRAVENOUS

## 2021-03-01 MED ORDER — ASPIRIN EC 81 MG PO TBEC: 81.0000 mg | DELAYED_RELEASE_TABLET | Freq: Two times a day (BID) | ORAL | 0 refills | Status: AC

## 2021-03-01 MED ORDER — LACTATED RINGERS IV SOLN: INTRAVENOUS | Status: DC

## 2021-03-01 MED ORDER — CEFAZOLIN SODIUM-DEXTROSE 2-4 GM/100ML-% IV SOLN
2.0000 g | INTRAVENOUS | Status: AC
  Administered 2021-03-01: 2 g via INTRAVENOUS

## 2021-03-01 MED ORDER — KETOROLAC TROMETHAMINE 30 MG/ML IJ SOLN
INTRAMUSCULAR | Status: AC
  Filled 2021-03-01: qty 1

## 2021-03-01 MED ORDER — METHYLPREDNISOLONE 4 MG PO TBPK: ORAL_TABLET | ORAL | 0 refills | Status: AC

## 2021-03-01 MED ORDER — PROMETHAZINE HCL 25 MG/ML IJ SOLN: 6.2500 mg | INTRAMUSCULAR | Status: DC | PRN

## 2021-03-01 MED ORDER — SCOPOLAMINE 1 MG/3DAYS TD PT72
MEDICATED_PATCH | TRANSDERMAL | Status: AC
  Filled 2021-03-01: qty 1

## 2021-03-01 MED ORDER — LIDOCAINE HCL (CARDIAC) PF 100 MG/5ML IV SOSY
PREFILLED_SYRINGE | INTRAVENOUS | Status: DC | PRN
  Administered 2021-03-01: 60 mg via INTRAVENOUS

## 2021-03-01 MED ORDER — PROPOFOL 10 MG/ML IV BOLUS
INTRAVENOUS | Status: AC
  Filled 2021-03-01: qty 40

## 2021-03-01 MED ORDER — SCOPOLAMINE 1 MG/3DAYS TD PT72
1.0000 | MEDICATED_PATCH | TRANSDERMAL | Status: DC
  Administered 2021-03-01: 1.5 mg via TRANSDERMAL

## 2021-03-01 MED ORDER — PROPOFOL 10 MG/ML IV BOLUS
INTRAVENOUS | Status: DC | PRN
  Administered 2021-03-01: 200 mg via INTRAVENOUS

## 2021-03-01 MED ORDER — ONDANSETRON HCL 4 MG/2ML IJ SOLN
INTRAMUSCULAR | Status: DC | PRN
  Administered 2021-03-01: 4 mg via INTRAVENOUS

## 2021-03-01 MED ORDER — LIDOCAINE HCL (PF) 2 % IJ SOLN
INTRAMUSCULAR | Status: AC
  Filled 2021-03-01: qty 5

## 2021-03-01 MED ORDER — OXYCODONE HCL 5 MG/5ML PO SOLN
5.0000 mg | Freq: Once | ORAL | Status: DC | PRN
Start: 2021-03-01 — End: 2021-03-01

## 2021-03-01 MED ORDER — HYDROMORPHONE HCL 1 MG/ML IJ SOLN: 0.2500 mg | INTRAMUSCULAR | Status: DC | PRN

## 2021-03-01 MED ORDER — ONDANSETRON HCL 4 MG/2ML IJ SOLN
INTRAMUSCULAR | Status: AC
  Filled 2021-03-01: qty 2

## 2021-03-01 SURGICAL SUPPLY — 49 items
1.4 Wire ×2 IMPLANT
BLADE SURG 15 STRL LF DISP TIS (BLADE) ×2 IMPLANT
BLADE SURG 15 STRL SS (BLADE) ×4
BNDG COHESIVE 3X5 TAN STRL LF (GAUZE/BANDAGES/DRESSINGS) ×2 IMPLANT
BNDG ELASTIC 4X5.8 VLCR STR LF (GAUZE/BANDAGES/DRESSINGS) ×2 IMPLANT
BNDG ELASTIC 6X5.8 VLCR STR LF (GAUZE/BANDAGES/DRESSINGS) ×2 IMPLANT
BNDG GAUZE ELAST 4 BULKY (GAUZE/BANDAGES/DRESSINGS) ×2 IMPLANT
COVER MAYO STAND STRL (DRAPES) ×2 IMPLANT
COVER WAND RF STERILE (DRAPES) ×2 IMPLANT
CUFF TOURN SGL QUICK 24 (TOURNIQUET CUFF) ×2
CUFF TRNQT CYL 24X4X16.5-23 (TOURNIQUET CUFF) ×1 IMPLANT
DRAPE C-ARM 42X120 X-RAY (DRAPES) ×2 IMPLANT
DRAPE C-ARMOR (DRAPES) ×2 IMPLANT
DRAPE IMP U-DRAPE 54X76 (DRAPES) ×2 IMPLANT
DRAPE ORTHO SPLIT 77X108 STRL (DRAPES) ×4
DRAPE SURG ORHT 6 SPLT 77X108 (DRAPES) ×2 IMPLANT
DRAPE U-SHAPE 47X51 STRL (DRAPES) ×2 IMPLANT
DRSG ADAPTIC 3X8 NADH LF (GAUZE/BANDAGES/DRESSINGS) ×2 IMPLANT
DRSG EMULSION OIL 3X3 NADH (GAUZE/BANDAGES/DRESSINGS) ×2 IMPLANT
DRSG PAD ABDOMINAL 8X10 ST (GAUZE/BANDAGES/DRESSINGS) ×2 IMPLANT
DURAPREP 26ML APPLICATOR (WOUND CARE) ×2 IMPLANT
ELECT REM PT RETURN 9FT ADLT (ELECTROSURGICAL) ×2
ELECTRODE REM PT RTRN 9FT ADLT (ELECTROSURGICAL) ×1 IMPLANT
GAUZE SPONGE 4X4 12PLY STRL (GAUZE/BANDAGES/DRESSINGS) ×2 IMPLANT
GLOVE SRG 8 PF TXTR STRL LF DI (GLOVE) ×2 IMPLANT
GLOVE SURG ENC MOIS LTX SZ7 (GLOVE) ×2 IMPLANT
GLOVE SURG ENC MOIS LTX SZ7.5 (GLOVE) ×4 IMPLANT
GLOVE SURG POLYISO LF SZ6.5 (GLOVE) ×2 IMPLANT
GLOVE SURG UNDER POLY LF SZ7 (GLOVE) ×2 IMPLANT
GLOVE SURG UNDER POLY LF SZ7.5 (GLOVE) ×2 IMPLANT
GLOVE SURG UNDER POLY LF SZ8 (GLOVE) ×4
GOWN STRL REUS W/TWL LRG LVL3 (GOWN DISPOSABLE) ×8 IMPLANT
K-WIRE ORTHOPEDIC 1.4X150L (WIRE) ×2
KIT BASIN OR (CUSTOM PROCEDURE TRAY) ×2 IMPLANT
KIT TURNOVER CYSTO (KITS) ×2 IMPLANT
KWIRE ORTHOPEDIC 1.4X150L (WIRE) ×1 IMPLANT
MANIFOLD NEPTUNE II (INSTRUMENTS) ×2 IMPLANT
NEEDLE HYPO 22GX1.5 SAFETY (NEEDLE) ×2 IMPLANT
NS IRRIG 1000ML POUR BTL (IV SOLUTION) ×2 IMPLANT
PENCIL SMOKE EVACUATOR (MISCELLANEOUS) ×2 IMPLANT
STRIP CLOSURE SKIN 1/2X4 (GAUZE/BANDAGES/DRESSINGS) ×2 IMPLANT
SUT MNCRL AB 4-0 PS2 18 (SUTURE) ×2 IMPLANT
SUT MON AB 2-0 CT1 27 (SUTURE) ×2 IMPLANT
SUT VIC AB 0 CT1 27 (SUTURE) ×4
SUT VIC AB 0 CT1 27XBRD ANBCTR (SUTURE) ×2 IMPLANT
SYR CONTROL 10ML LL (SYRINGE) ×2 IMPLANT
TOWEL OR 17X26 10 PK STRL BLUE (TOWEL DISPOSABLE) ×2 IMPLANT
TOWEL OR NON WOVEN STRL DISP B (DISPOSABLE) ×2 IMPLANT
TUBE CONNECTING 12X1/4 (SUCTIONS) ×2 IMPLANT

## 2021-03-01 NOTE — Op Note (Signed)
03/01/2021  10:04 AM  PATIENT:  Joanne Nelson    PRE-OPERATIVE DIAGNOSIS:  LEFT KNEE ANKYLOSIS, PAINFUL HARDWARE  POST-OPERATIVE DIAGNOSIS:  Same  PROCEDURE:  HARDWARE REMOVAL WITH KNEE MANIPULATION  SURGEON:  Sheral Apley, MD  ASSISTANT: Levester Fresh, PA-C, he was present and scrubbed throughout the case, critical for completion in a timely fashion, and for retraction, instrumentation, and closure.   ANESTHESIA:   gen  PREOPERATIVE INDICATIONS:  Joanne Nelson is a  20 y.o. female with a diagnosis of LEFT KNEE ANKYLOSIS, PAINFUL HARDWARE who failed conservative measures and elected for surgical management.    The risks benefits and alternatives were discussed with the patient preoperatively including but not limited to the risks of infection, bleeding, nerve injury, cardiopulmonary complications, the need for revision surgery, among others, and the patient was willing to proceed.  OPERATIVE IMPLANTS: none  OPERATIVE FINDINGS: ROM 1-45 improved to 1-130  BLOOD LOSS: min  COMPLICATIONS: none  TOURNIQUET TIME:  OPERATIVE PROCEDURE:  Patient was identified in the preoperative holding area and site was marked by me She was transported to the operating theater and placed on the table in supine position taking care to pad all bony prominences. After a preincinduction time out anesthesia was induced. The left lower extremity was prepped and draped in normal sterile fashion and a pre-incision timeout was performed. She received ancef for preoperative antibiotics.   I made a lateral incision through her previous incision I incised her IT band longitudinally dissected down to her cannulated screws.  I was able to remove both screws which came out whole I thoroughly irrigated this incision I closed her IT band followed by her skin in layers.  Next I performed a manipulation of the left knee range of motion improved as per above.  Sterile dressings were applied she was I  injected anesthetic locally as well as into her knee joint for 5 cc.  She was taken the PACU in stable condition  POST OPERATIVE PLAN: Weightbearing as tolerated start physical therapy in 1 to 2 days mobilize for DVT prophylaxis

## 2021-03-01 NOTE — Discharge Instructions (Addendum)
POST-OPERATIVE OPIOID TAPER INSTRUCTIONS: It is important to wean off of your opioid medication as soon as possible. If you do not need pain medication after your surgery it is ok to stop day one. Opioids include: Codeine, Hydrocodone(Norco, Vicodin), Oxycodone(Percocet, oxycontin) and hydromorphone amongst others.  Long term and even short term use of opiods can cause: Increased pain response Dependence Constipation Depression Respiratory depression And more.  Withdrawal symptoms can include Flu like symptoms Nausea, vomiting And more Techniques to manage these symptoms Hydrate well Eat regular healthy meals Stay active Use relaxation techniques(deep breathing, meditating, yoga) Do Not substitute Alcohol to help with tapering If you have been on opioids for less than two weeks and do not have pain than it is ok to stop all together.  Plan to wean off of opioids This plan should start within one week post op of your joint replacement. Maintain the same interval or time between taking each dose and first decrease the dose.  Cut the total daily intake of opioids by one tablet each day Next start to increase the time between doses. The last dose that should be eliminated is the evening dose.     Post Anesthesia Home Care Instructions  Activity: Get plenty of rest for the remainder of the day. A responsible individual must stay with you for 24 hours following the procedure.  For the next 24 hours, DO NOT: -Drive a car -Operate machinery -Drink alcoholic beverages -Take any medication unless instructed by your physician -Make any legal decisions or sign important papers.  Meals: Start with liquid foods such as gelatin or soup. Progress to regular foods as tolerated. Avoid greasy, spicy, heavy foods. If nausea and/or vomiting occur, drink only clear liquids until the nausea and/or vomiting subsides. Call your physician if vomiting continues.  Special Instructions/Symptoms: Your  throat may feel dry or sore from the anesthesia or the breathing tube placed in your throat during surgery. If this causes discomfort, gargle with warm salt water. The discomfort should disappear within 24 hours.  If you had a scopolamine patch placed behind your ear for the management of post- operative nausea and/or vomiting:  1. The medication in the patch is effective for 72 hours, after which it should be removed.  Wrap patch in a tissue and discard in the trash. Wash hands thoroughly with soap and water. 2. You may remove the patch earlier than 72 hours if you experience unpleasant side effects which may include dry mouth, dizziness or visual disturbances. 3. Avoid touching the patch. Wash your hands with soap and water after contact with the patch.     

## 2021-03-01 NOTE — Anesthesia Postprocedure Evaluation (Signed)
Anesthesia Post Note  Patient: Joanne Nelson  Procedure(s) Performed: HARDWARE REMOVAL WITH KNEE MANIPULATION (Left: Knee)     Patient location during evaluation: PACU Anesthesia Type: General Level of consciousness: awake and alert, patient cooperative and oriented Pain management: pain level controlled Vital Signs Assessment: post-procedure vital signs reviewed and stable Respiratory status: spontaneous breathing, nonlabored ventilation and respiratory function stable Cardiovascular status: blood pressure returned to baseline and stable Postop Assessment: no apparent nausea or vomiting and adequate PO intake Anesthetic complications: no   No notable events documented.  Last Vitals:  Vitals:   03/01/21 1030 03/01/21 1115  BP: (!) 113/98 136/88  Pulse: 98 89  Resp: (!) 21 16  Temp:  36.7 C  SpO2: 100% 99%    Last Pain:  Vitals:   03/01/21 1115  TempSrc:   PainSc: 2                  Mcdaniel Ohms,E. Amala Petion

## 2021-03-01 NOTE — Transfer of Care (Signed)
Immediate Anesthesia Transfer of Care Note  Patient: Joanne Nelson  Procedure(s) Performed: Procedure(s) (LRB): HARDWARE REMOVAL WITH KNEE MANIPULATION (Left)  Patient Location: PACU  Anesthesia Type: General  Level of Consciousness: awake, sedated, patient cooperative and responds to stimulation  Airway & Oxygen Therapy: Patient Spontanous Breathing and Patient connected to Lasara 02 and soft FM   Post-op Assessment: Report given to PACU RN, Post -op Vital signs reviewed and stable and Patient moving all extremities  Post vital signs: Reviewed and stable  Complications: No apparent anesthesia complications

## 2021-03-01 NOTE — Interval H&P Note (Signed)
History and Physical Interval Note:  03/01/2021 8:45 AM  Joanne Nelson  has presented today for surgery, with the diagnosis of LEFT KNEE ANKYLOSIS, PAINFUL HARDWARE.  The various methods of treatment have been discussed with the patient and family. After consideration of risks, benefits and other options for treatment, the patient has consented to  Procedure(s): HARDWARE REMOVAL WITH KNEE MANIPULATION (Left) as a surgical intervention.  The patient's history has been reviewed, patient examined, no change in status, stable for surgery.  I have reviewed the patient's chart and labs.  Questions were answered to the patient's satisfaction.     Sheral Apley

## 2021-03-01 NOTE — Anesthesia Procedure Notes (Signed)
Procedure Name: LMA Insertion Date/Time: 03/01/2021 8:57 AM Performed by: Jessica Priest, CRNA Pre-anesthesia Checklist: Patient identified, Emergency Drugs available, Suction available, Patient being monitored and Timeout performed Patient Re-evaluated:Patient Re-evaluated prior to induction Oxygen Delivery Method: Circle system utilized Preoxygenation: Pre-oxygenation with 100% oxygen Induction Type: IV induction Ventilation: Mask ventilation without difficulty LMA: LMA inserted LMA Size: 4.0 Number of attempts: 1 Airway Equipment and Method: Bite block Placement Confirmation: positive ETCO2, CO2 detector and breath sounds checked- equal and bilateral Tube secured with: Tape Dental Injury: Teeth and Oropharynx as per pre-operative assessment

## 2021-03-02 ENCOUNTER — Encounter (HOSPITAL_BASED_OUTPATIENT_CLINIC_OR_DEPARTMENT_OTHER): Payer: Self-pay | Admitting: Orthopedic Surgery

## 2021-03-07 ENCOUNTER — Other Ambulatory Visit: Payer: BC Managed Care – PPO

## 2021-03-22 ENCOUNTER — Encounter (HOSPITAL_BASED_OUTPATIENT_CLINIC_OR_DEPARTMENT_OTHER): Payer: Self-pay | Admitting: Orthopedic Surgery
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
